# Patient Record
Sex: Male | Born: 1940 | Race: White | Hispanic: No | Marital: Married | Smoking: Never smoker
Health system: Southern US, Community
[De-identification: ages and names within clinical notes are randomized; demographics above are authoritative.]

## PROBLEM LIST (undated history)

## (undated) DIAGNOSIS — I10 Essential (primary) hypertension: Secondary | ICD-10-CM

## (undated) DIAGNOSIS — R42 Dizziness and giddiness: Secondary | ICD-10-CM

## (undated) DIAGNOSIS — K5790 Diverticulosis of intestine, part unspecified, without perforation or abscess without bleeding: Secondary | ICD-10-CM

## (undated) DIAGNOSIS — C61 Malignant neoplasm of prostate: Secondary | ICD-10-CM

## (undated) DIAGNOSIS — E785 Hyperlipidemia, unspecified: Secondary | ICD-10-CM

## (undated) DIAGNOSIS — I4891 Unspecified atrial fibrillation: Secondary | ICD-10-CM

## (undated) DIAGNOSIS — C4491 Basal cell carcinoma of skin, unspecified: Secondary | ICD-10-CM

## (undated) HISTORY — DX: Unspecified atrial fibrillation: I48.91

## (undated) HISTORY — DX: Dizziness and giddiness: R42

## (undated) HISTORY — DX: Diverticulosis of intestine, part unspecified, without perforation or abscess without bleeding: K57.90

## (undated) HISTORY — PX: COLONOSCOPY: SHX174

## (undated) HISTORY — DX: Basal cell carcinoma of skin, unspecified: C44.91

## (undated) HISTORY — DX: Hyperlipidemia, unspecified: E78.5

## (undated) HISTORY — PX: ELBOW SURGERY: SHX618

## (undated) HISTORY — PX: CATARACT EXTRACTION W/ INTRAOCULAR LENS IMPLANT: SHX1309

## (undated) HISTORY — PX: VASECTOMY: SHX75

## (undated) HISTORY — PX: KNEE ARTHROSCOPY: SUR90

## (undated) HISTORY — PX: HERNIA REPAIR: SHX51

---

## 1998-04-13 ENCOUNTER — Ambulatory Visit (HOSPITAL_COMMUNITY): Admission: RE | Admit: 1998-04-13 | Discharge: 1998-04-13 | Payer: Self-pay | Admitting: Gastroenterology

## 1998-11-17 ENCOUNTER — Observation Stay (HOSPITAL_COMMUNITY): Admission: RE | Admit: 1998-11-17 | Discharge: 1998-11-18 | Payer: Self-pay | Admitting: Orthopedic Surgery

## 1998-11-17 ENCOUNTER — Encounter: Payer: Self-pay | Admitting: Orthopedic Surgery

## 2001-04-30 ENCOUNTER — Encounter: Admission: RE | Admit: 2001-04-30 | Discharge: 2001-04-30 | Payer: Self-pay | Admitting: Orthopaedic Surgery

## 2001-05-18 ENCOUNTER — Encounter: Admission: RE | Admit: 2001-05-18 | Discharge: 2001-05-18 | Payer: Self-pay | Admitting: Orthopaedic Surgery

## 2001-06-11 ENCOUNTER — Encounter: Admission: RE | Admit: 2001-06-11 | Discharge: 2001-06-11 | Payer: Self-pay | Admitting: Orthopaedic Surgery

## 2004-07-22 ENCOUNTER — Ambulatory Visit: Payer: Self-pay | Admitting: Internal Medicine

## 2004-08-12 ENCOUNTER — Ambulatory Visit: Payer: Self-pay | Admitting: Internal Medicine

## 2004-10-29 ENCOUNTER — Encounter: Admission: RE | Admit: 2004-10-29 | Discharge: 2004-10-29 | Payer: Self-pay | Admitting: Orthopedic Surgery

## 2004-11-10 ENCOUNTER — Ambulatory Visit: Payer: Self-pay | Admitting: Internal Medicine

## 2005-03-31 ENCOUNTER — Ambulatory Visit: Payer: Self-pay | Admitting: Internal Medicine

## 2005-06-03 ENCOUNTER — Ambulatory Visit: Payer: Self-pay | Admitting: Internal Medicine

## 2005-07-13 ENCOUNTER — Ambulatory Visit: Payer: Self-pay | Admitting: Internal Medicine

## 2005-07-28 ENCOUNTER — Ambulatory Visit: Payer: Self-pay | Admitting: Internal Medicine

## 2006-03-22 ENCOUNTER — Ambulatory Visit: Payer: Self-pay | Admitting: Internal Medicine

## 2006-07-21 DIAGNOSIS — E785 Hyperlipidemia, unspecified: Secondary | ICD-10-CM

## 2007-03-21 ENCOUNTER — Ambulatory Visit: Payer: Self-pay | Admitting: Internal Medicine

## 2007-03-21 LAB — CONVERTED CEMR LAB
AST: 35 units/L (ref 0–37)
BUN: 20 mg/dL (ref 6–23)
Calcium: 9.4 mg/dL (ref 8.4–10.5)
Cholesterol: 159 mg/dL (ref 0–200)
Eosinophils Absolute: 0.2 10*3/uL (ref 0.0–0.6)
GFR calc Af Amer: 71 mL/min
GFR calc non Af Amer: 59 mL/min
HDL: 48.7 mg/dL (ref >39.0)
Lymphocytes Relative: 44.6 % (ref 12.0–46.0)
MCHC: 34.9 g/dL (ref 30.0–36.0)
MCV: 93.1 fL (ref 78.0–100.0)
Monocytes Relative: 10.2 % (ref 3.0–11.0)
Neutro Abs: 1.7 10*3/uL (ref 1.4–7.7)
PSA: 1.27 ng/mL (ref 0.10–4.00)
Platelets: 230 10*3/uL (ref 150–400)
Potassium: 5.6 meq/L — ABNORMAL HIGH (ref 3.5–5.1)
Triglycerides: 80 mg/dL (ref 0–149)

## 2007-03-28 ENCOUNTER — Ambulatory Visit: Payer: Self-pay | Admitting: Internal Medicine

## 2007-03-28 ENCOUNTER — Encounter (INDEPENDENT_AMBULATORY_CARE_PROVIDER_SITE_OTHER): Payer: Self-pay | Admitting: *Deleted

## 2007-03-29 ENCOUNTER — Ambulatory Visit: Payer: Self-pay | Admitting: Internal Medicine

## 2007-06-11 ENCOUNTER — Telehealth: Payer: Self-pay | Admitting: Internal Medicine

## 2007-10-22 ENCOUNTER — Ambulatory Visit: Payer: Self-pay | Admitting: Internal Medicine

## 2007-10-23 ENCOUNTER — Ambulatory Visit: Payer: Self-pay | Admitting: Internal Medicine

## 2007-10-24 ENCOUNTER — Telehealth: Payer: Self-pay | Admitting: Internal Medicine

## 2007-10-24 ENCOUNTER — Encounter: Admission: RE | Admit: 2007-10-24 | Discharge: 2007-10-24 | Payer: Self-pay | Admitting: Internal Medicine

## 2007-10-24 ENCOUNTER — Encounter: Payer: Self-pay | Admitting: Internal Medicine

## 2007-10-24 LAB — CONVERTED CEMR LAB
AST: 25 units/L (ref 0–37)
Amylase: 123 units/L (ref 27–131)
BUN: 17 mg/dL (ref 6–23)
Basophils Absolute: 0 10*3/uL (ref 0.0–0.1)
Creatinine, Ser: 1.3 mg/dL (ref 0.4–1.5)
Eosinophils Absolute: 0.2 10*3/uL (ref 0.0–0.7)
Hemoglobin: 15.3 g/dL (ref 13.0–17.0)
Lipase: 69 units/L — ABNORMAL HIGH (ref 11.0–59.0)
Lymphocytes Relative: 38.2 % (ref 12.0–46.0)
MCHC: 33.1 g/dL (ref 30.0–36.0)
MCV: 94.2 fL (ref 78.0–100.0)
Neutro Abs: 2.2 10*3/uL (ref 1.4–7.7)
Neutrophils Relative %: 46.6 % (ref 43.0–77.0)
RDW: 12.2 % (ref 11.5–14.6)
Total Bilirubin: 1.5 mg/dL — ABNORMAL HIGH (ref 0.3–1.2)

## 2007-10-25 ENCOUNTER — Encounter (INDEPENDENT_AMBULATORY_CARE_PROVIDER_SITE_OTHER): Payer: Self-pay | Admitting: *Deleted

## 2007-10-29 ENCOUNTER — Encounter: Admission: RE | Admit: 2007-10-29 | Discharge: 2007-10-29 | Payer: Self-pay | Admitting: Internal Medicine

## 2007-10-29 ENCOUNTER — Telehealth (INDEPENDENT_AMBULATORY_CARE_PROVIDER_SITE_OTHER): Payer: Self-pay | Admitting: *Deleted

## 2007-11-01 ENCOUNTER — Ambulatory Visit: Payer: Self-pay | Admitting: Internal Medicine

## 2007-11-01 ENCOUNTER — Encounter (INDEPENDENT_AMBULATORY_CARE_PROVIDER_SITE_OTHER): Payer: Self-pay | Admitting: *Deleted

## 2007-11-01 LAB — CONVERTED CEMR LAB: OCCULT 1: NEGATIVE

## 2008-03-26 ENCOUNTER — Ambulatory Visit: Payer: Self-pay | Admitting: Internal Medicine

## 2008-06-09 ENCOUNTER — Ambulatory Visit: Payer: Self-pay | Admitting: Internal Medicine

## 2008-06-09 DIAGNOSIS — M545 Low back pain: Secondary | ICD-10-CM

## 2008-06-09 DIAGNOSIS — K5730 Diverticulosis of large intestine without perforation or abscess without bleeding: Secondary | ICD-10-CM | POA: Insufficient documentation

## 2008-06-09 LAB — CONVERTED CEMR LAB: Cholesterol, target level: 200 mg/dL

## 2008-06-10 LAB — CONVERTED CEMR LAB
ALT: 27 units/L (ref 0–53)
Alkaline Phosphatase: 55 units/L (ref 39–117)
Basophils Absolute: 0 10*3/uL (ref 0.0–0.1)
Bilirubin, Direct: 0.2 mg/dL (ref 0.0–0.3)
CO2: 33 meq/L — ABNORMAL HIGH (ref 19–32)
Calcium: 9.7 mg/dL (ref 8.4–10.5)
Cholesterol: 165 mg/dL (ref 0–200)
Glucose, Bld: 110 mg/dL — ABNORMAL HIGH (ref 70–99)
HDL: 61 mg/dL (ref >39.0)
LDL Cholesterol: 91 mg/dL (ref 0–99)
Lymphocytes Relative: 39.3 % (ref 12.0–46.0)
MCHC: 34.6 g/dL (ref 30.0–36.0)
Monocytes Relative: 11 % (ref 3.0–12.0)
Neutrophils Relative %: 43.7 % (ref 43.0–77.0)
Platelets: 213 10*3/uL (ref 150–400)
Potassium: 5.3 meq/L — ABNORMAL HIGH (ref 3.5–5.1)
RDW: 12.2 % (ref 11.5–14.6)
Sodium: 145 meq/L (ref 135–145)
TSH: 1.79 microintl units/mL (ref 0.35–5.50)
Total Bilirubin: 2.1 mg/dL — ABNORMAL HIGH (ref 0.3–1.2)
Total CHOL/HDL Ratio: 2.7
Total Protein: 6.8 g/dL (ref 6.0–8.3)
Triglycerides: 65 mg/dL (ref 0–149)
VLDL: 13 mg/dL (ref 0–40)

## 2008-06-11 ENCOUNTER — Encounter (INDEPENDENT_AMBULATORY_CARE_PROVIDER_SITE_OTHER): Payer: Self-pay | Admitting: *Deleted

## 2008-06-24 ENCOUNTER — Encounter (INDEPENDENT_AMBULATORY_CARE_PROVIDER_SITE_OTHER): Payer: Self-pay | Admitting: *Deleted

## 2008-06-24 ENCOUNTER — Ambulatory Visit: Payer: Self-pay | Admitting: Internal Medicine

## 2008-06-24 LAB — CONVERTED CEMR LAB
OCCULT 2: NEGATIVE
OCCULT 3: NEGATIVE

## 2008-06-29 LAB — CONVERTED CEMR LAB: Hgb A1c MFr Bld: 5.9 % (ref 4.6–6.0)

## 2008-06-30 ENCOUNTER — Encounter (INDEPENDENT_AMBULATORY_CARE_PROVIDER_SITE_OTHER): Payer: Self-pay | Admitting: *Deleted

## 2008-07-21 ENCOUNTER — Telehealth (INDEPENDENT_AMBULATORY_CARE_PROVIDER_SITE_OTHER): Payer: Self-pay | Admitting: *Deleted

## 2008-12-23 ENCOUNTER — Ambulatory Visit: Payer: Self-pay | Admitting: Internal Medicine

## 2008-12-23 DIAGNOSIS — I872 Venous insufficiency (chronic) (peripheral): Secondary | ICD-10-CM | POA: Insufficient documentation

## 2008-12-26 ENCOUNTER — Ambulatory Visit: Payer: Self-pay | Admitting: Internal Medicine

## 2008-12-31 ENCOUNTER — Encounter (INDEPENDENT_AMBULATORY_CARE_PROVIDER_SITE_OTHER): Payer: Self-pay | Admitting: *Deleted

## 2008-12-31 ENCOUNTER — Telehealth (INDEPENDENT_AMBULATORY_CARE_PROVIDER_SITE_OTHER): Payer: Self-pay | Admitting: *Deleted

## 2008-12-31 LAB — CONVERTED CEMR LAB: Creatinine, Ser: 1.6 mg/dL — ABNORMAL HIGH (ref 0.4–1.5)

## 2009-01-09 ENCOUNTER — Encounter: Admission: RE | Admit: 2009-01-09 | Discharge: 2009-01-09 | Payer: Self-pay | Admitting: Internal Medicine

## 2009-01-09 ENCOUNTER — Encounter (INDEPENDENT_AMBULATORY_CARE_PROVIDER_SITE_OTHER): Payer: Self-pay | Admitting: *Deleted

## 2009-01-09 ENCOUNTER — Telehealth (INDEPENDENT_AMBULATORY_CARE_PROVIDER_SITE_OTHER): Payer: Self-pay | Admitting: *Deleted

## 2009-01-15 ENCOUNTER — Telehealth: Payer: Self-pay | Admitting: Internal Medicine

## 2009-01-22 ENCOUNTER — Encounter: Admission: RE | Admit: 2009-01-22 | Discharge: 2009-01-22 | Payer: Self-pay | Admitting: Internal Medicine

## 2009-01-29 ENCOUNTER — Ambulatory Visit: Payer: Self-pay | Admitting: Internal Medicine

## 2009-01-29 LAB — CONVERTED CEMR LAB
BUN: 16 mg/dL (ref 6–23)
Calcium: 9.6 mg/dL (ref 8.4–10.5)
GFR calc non Af Amer: 58.38 mL/min (ref >60)
Glucose, Bld: 124 mg/dL — ABNORMAL HIGH (ref 70–99)
Potassium: 4.4 meq/L (ref 3.5–5.1)
Sodium: 143 meq/L (ref 135–145)

## 2009-01-30 ENCOUNTER — Encounter (INDEPENDENT_AMBULATORY_CARE_PROVIDER_SITE_OTHER): Payer: Self-pay | Admitting: *Deleted

## 2009-03-18 ENCOUNTER — Ambulatory Visit: Payer: Self-pay | Admitting: Internal Medicine

## 2009-06-01 ENCOUNTER — Ambulatory Visit: Payer: Self-pay | Admitting: Internal Medicine

## 2009-06-01 LAB — CONVERTED CEMR LAB
ALT: 29 units/L (ref 0–53)
AST: 29 units/L (ref 0–37)
Basophils Absolute: 0.1 10*3/uL (ref 0.0–0.1)
Basophils Relative: 1.1 % (ref 0.0–3.0)
Bilirubin, Direct: 0.1 mg/dL (ref 0.0–0.3)
Cholesterol: 172 mg/dL (ref 0–200)
Eosinophils Absolute: 0.3 10*3/uL (ref 0.0–0.7)
Lymphocytes Relative: 38.1 % (ref 12.0–46.0)
MCHC: 33.8 g/dL (ref 30.0–36.0)
MCV: 95.3 fL (ref 78.0–100.0)
Monocytes Absolute: 0.6 10*3/uL (ref 0.1–1.0)
Neutrophils Relative %: 44.5 % (ref 43.0–77.0)
PSA: 3.48 ng/mL (ref 0.10–4.00)
Platelets: 239 10*3/uL (ref 150.0–400.0)
RDW: 12.2 % (ref 11.5–14.6)
TSH: 2.21 microintl units/mL (ref 0.35–5.50)
Total Bilirubin: 1.3 mg/dL — ABNORMAL HIGH (ref 0.3–1.2)
Total Protein: 6.7 g/dL (ref 6.0–8.3)

## 2009-06-11 ENCOUNTER — Ambulatory Visit: Payer: Self-pay | Admitting: Internal Medicine

## 2009-07-13 ENCOUNTER — Encounter: Payer: Self-pay | Admitting: Internal Medicine

## 2009-12-31 ENCOUNTER — Encounter: Payer: Self-pay | Admitting: Internal Medicine

## 2010-03-17 ENCOUNTER — Telehealth (INDEPENDENT_AMBULATORY_CARE_PROVIDER_SITE_OTHER): Payer: Self-pay | Admitting: *Deleted

## 2010-03-24 ENCOUNTER — Telehealth (INDEPENDENT_AMBULATORY_CARE_PROVIDER_SITE_OTHER): Payer: Self-pay | Admitting: *Deleted

## 2010-03-30 ENCOUNTER — Telehealth: Payer: Self-pay | Admitting: Internal Medicine

## 2010-04-01 ENCOUNTER — Encounter: Payer: Self-pay | Admitting: Internal Medicine

## 2010-04-05 ENCOUNTER — Encounter: Payer: Self-pay | Admitting: Internal Medicine

## 2010-05-17 ENCOUNTER — Encounter: Admission: RE | Admit: 2010-05-17 | Discharge: 2010-05-17 | Payer: Self-pay | Admitting: General Surgery

## 2010-06-07 ENCOUNTER — Encounter: Payer: Self-pay | Admitting: Internal Medicine

## 2010-06-18 ENCOUNTER — Ambulatory Visit: Payer: Self-pay | Admitting: Internal Medicine

## 2010-06-18 LAB — CONVERTED CEMR LAB
Albumin: 3.6 g/dL (ref 3.5–5.2)
Alkaline Phosphatase: 53 units/L (ref 39–117)
Basophils Absolute: 0 10*3/uL (ref 0.0–0.1)
Blood in Urine, dipstick: NEGATIVE
CO2: 30 meq/L (ref 19–32)
Calcium: 9.4 mg/dL (ref 8.4–10.5)
Cholesterol: 180 mg/dL (ref 0–200)
Creatinine, Ser: 1.2 mg/dL (ref 0.4–1.5)
Eosinophils Absolute: 0.3 10*3/uL (ref 0.0–0.7)
Glucose, Bld: 99 mg/dL (ref 70–99)
HDL: 55.4 mg/dL (ref >39.00)
Hemoglobin: 15.4 g/dL (ref 13.0–17.0)
Lymphocytes Relative: 43.1 % (ref 12.0–46.0)
Lymphs Abs: 2.5 10*3/uL (ref 0.7–4.0)
MCHC: 33.8 g/dL (ref 30.0–36.0)
Neutro Abs: 2.4 10*3/uL (ref 1.4–7.7)
Nitrite: NEGATIVE
Platelets: 236 10*3/uL (ref 150.0–400.0)
RDW: 13.5 % (ref 11.5–14.6)
Specific Gravity, Urine: 1.02
VLDL: 10.4 mg/dL (ref 0.0–40.0)
WBC Urine, dipstick: NEGATIVE

## 2010-06-28 ENCOUNTER — Encounter: Payer: Self-pay | Admitting: Internal Medicine

## 2010-06-28 ENCOUNTER — Ambulatory Visit
Admission: RE | Admit: 2010-06-28 | Discharge: 2010-06-28 | Payer: Self-pay | Source: Home / Self Care | Attending: Internal Medicine | Admitting: Internal Medicine

## 2010-07-09 ENCOUNTER — Ambulatory Visit
Admission: RE | Admit: 2010-07-09 | Discharge: 2010-07-09 | Payer: Self-pay | Source: Home / Self Care | Attending: Internal Medicine | Admitting: Internal Medicine

## 2010-07-09 ENCOUNTER — Encounter (INDEPENDENT_AMBULATORY_CARE_PROVIDER_SITE_OTHER): Payer: Self-pay | Admitting: *Deleted

## 2010-07-12 ENCOUNTER — Encounter: Payer: Self-pay | Admitting: Internal Medicine

## 2010-07-20 NOTE — Progress Notes (Signed)
Summary: refill  Phone Note Refill Request Message from:  Fax from Pharmacy on March 24, 2010 4:33 PM  Refills Requested: Medication #1:  LOVAZA 1 GM CAPS 2 by mouth two times a day. [facility]- fax [phone]  Initial call taken by: Ronny Flurry 4:34 PM  Follow-up for Phone Call        Med was filled 03/17/2010, I called [facility] and informed them filled on 9/28 if not on file to fill at this time Follow-up by: Shonna Chock CMA,  March 24, 2010 4:56 PM

## 2010-07-20 NOTE — Progress Notes (Signed)
Summary: refill  Phone Note Refill Request Message from:  Fax from Pharmacy on March 17, 2010 3:46 PM  Refills Requested: Medication #1:  LOVAZA 1 GM CAPS 2 by mouth two times a day. [facility]- fax [phone]  Initial call taken by: Okey Regal [place],  March 17, 2010 3:57 PM    Prescriptions: LOVAZA 1 GM CAPS (OMEGA-3-ACID ETHYL ESTERS) 2 by mouth two times a day  #120 x 0   Entered by:   Shonna Chock CMA   Authorized by:   Marga Melnick MD   Signed by:   Shonna Chock CMA on 03/17/2010   Method used:   Electronically to        [facility] # [phone]* (retail)       [address]       [place], [place]  [address]       Ph: [phone]       Fax: [phone]   RxID:   [id number]

## 2010-07-20 NOTE — Medication Information (Signed)
Summary: Prior Authorization for Lovaza/[employer]  Prior Authorization for Lovaza/[employer]   Imported By: Lanelle Bal 04/08/2010 15:52:44  _____________________________________________________________________  External Attachment:    Type:   Image     Comment:   External Document

## 2010-07-20 NOTE — Consult Note (Signed)
Summary: [facility]  [facility]   Imported By: Lanelle Bal 07/20/2009 11:35:29  _____________________________________________________________________  External Attachment:    Type:   Image     Comment:   External Document

## 2010-07-20 NOTE — Letter (Signed)
Summary: [facility]  [facility]   Imported By: Lennie Odor 01/14/2010 09:22:51  _____________________________________________________________________  External Attachment:    Type:   Image     Comment:   External Document

## 2010-07-20 NOTE — Medication Information (Signed)
Summary: Approval for Lovaza/[employer]  Approval for Lovaza/[employer]   Imported By: Lanelle Bal 04/14/2010 09:15:51  _____________________________________________________________________  External Attachment:    Type:   Image     Comment:   External Document

## 2010-07-20 NOTE — Letter (Signed)
Summary: [facility]  [facility]   Imported By: Lanelle Bal 04/13/2010 10:48:09  _____________________________________________________________________  External Attachment:    Type:   Image     Comment:   External Document

## 2010-07-20 NOTE — Progress Notes (Signed)
Summary: Travis Hendricks  Phone Note Refill Request Message from:  Fax from Pharmacy on March 30, 2010 3:02 PM  Refills Requested: Medication #1:  LOVAZA 1 GM CAPS 2 by mouth two times a day. [facility]IOR AUTH NEEDED     ---***NOTE--PLAN DOES NOT COVER THIS MEDICATION.   [phone]    PT ID# [id number]  Initial call taken by: Jerolyn Shin,  March 30, 2010 3:05 PM  Follow-up for Phone Call        [facility], preferred alternatives are Fenofibrate, Gemfibrozil, Lipofen, and Tricor. Please advise.  Follow-up by: Lucious Groves CMA,  March 31, 2010 10:01 AM  Additional Follow-up for Phone Call Additional follow up Details #1::        these are NOT equivalent meds; they are in totally different classes. He can call his plan & discuss. Also see if Rep for [employer] has discount cards he can use Additional Follow-up by: Marga Melnick MD,  March 31, 2010 10:23 AM    Additional Follow-up for Phone Call Additional follow up Details #2::    PA forms requested. Lucious Groves CMA  March 31, 2010 11:48 AM  Forms requested again. Lucious Groves CMA  April 01, 2010 12:21 PM   Forms completed and faxed, will await insurance company reply. Lucious Groves CMA  April 01, 2010 3:22 PM    Appended Document: Travis Hendricks Approved until 2012.

## 2010-07-22 NOTE — Assessment & Plan Note (Signed)
Summary: CPX,LABS PRIOR,UCH/RH......   Vital Signs:  Patient profile:   70 year old male Height:      68 inches Weight:      179 pounds BMI:     27.32 Temp:     98.2 degrees F oral Pulse rate:   60 / minute Resp:     14 per minute BP sitting:   126 / 80  (left arm) Cuff size:   large  Vitals Entered By: Shonna Chock CMA (June 28, 2010 8:26 AM)    History of Present Illness:    Mr. Siever is here for a physical; he continues to have discomfort @ inguinal herniorrhaphy op site. 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   Hyperlipidemia Follow-Up: The patient denies muscle aches, GI upset, abdominal pain, constipation, diarrhea, and fatigue.  The patient denies the following symptoms: chest pain/pressure, exercise intolerance, dypsnea, palpitations, syncope, and pedal edema.  Compliance with medications (by patient report) has been near 100%.  Dietary compliance has been good.  Adjunctive measures currently used by the patient include ASA and folic acid.    Lipid Management History:      Positive NCEP/ATP III risk factors include male age 83 years old or older.  Negative NCEP/ATP III risk factors include non-diabetic, no family history for ischemic heart disease, non-tobacco-user status, non-hypertensive, no ASHD (atherosclerotic heart disease), no prior stroke/TIA, no peripheral vascular disease, and no history of aortic aneurysm.     Preventive Screening-Counseling & Management  Alcohol-Tobacco     Alcohol drinks/day: 1     Smoking Status: never  Caffeine-Diet-Exercise     Caffeine use/day: 1/2-1 cup/ day  Hep-HIV-STD-Contraception     Dental Visit-last 6 months yes     Sun Exposure-Excessive: no  Safety-Violence-Falls     Seat Belt Use: yes     Smoke Detectors: yes      Blood Transfusions:  no.        Travel History:  [place] 2011.    Current Medications (verified): 1)  Crestor 20 Mg  Tabs (Rosuvastatin Calcium) .... 1/2 Tab Qd 2)   Tramadol Hcl 50 Mg Tabs (Tramadol Hcl) .Marland Kitchen.. 1-2  Q 6-8 Hrs As Needed 3)  Lovaza 1 Gm Caps (Omega-3-Acid Ethyl Esters) .... 2 By Mouth Two Times A Day  Allergies (verified): No Known Drug Allergies  Past History:  Past Medical History: Hyperlipidemia: NMR Lipoprofile :LDL 202(2539/1611), TG 92,HDL 44,: LDL goal =< 110. [place] Study LDL goal = < 160; elevated Homocysteine level, PMH of  Gilbert's Syndrome ; Peyronie's ,Dr Retta Diones, Urologist Diverticulosis, colon, Dr Ewing Schlein Psoriasis, Dr Mayford Knife  Past Surgical History: Arthroscopy L knee ; lens transplants bilaterally; Elbow surgery,tendon reinsertion, Dr Thurston Hole Colonoscopy :Tics,Dr Magod Lumbar laminectomy 2000,Dr Gioffre Vasectomy ESI X1 Inguinal herniorrhaphy 05/20/2010, Dr Abbey Chatters  Family History: Father: MI @ 57;died of  CVA  @ age 49 Mother: lung CA Siblings: bro: prostate  cancer   Social History: no diet Occupation:Avertising Executive Alcohol use-yes: socially Regular exercise-yes:  3-4X/ week , interrupted by recent surgery. He is going skiing in [place] 07/03/2010.He skis black slopesCaffeine use/day:  1/2-1 cup/ day Dental Care w/in 6 mos.:  yes Sun Exposure-Excessive:  no Seat Belt Use:  yes Blood Transfusions:  no  Review of Systems  The patient denies anorexia, fever, vision loss, decreased hearing, hoarseness, prolonged cough, headaches, melena, hematochezia, severe indigestion/heartburn, hematuria, suspicious skin lesions, depression, unusual weight change, abnormal bleeding, enlarged lymph nodes, and angioedema.  Weight up 5-6 # since surgery.  Physical Exam  General:  well-nourished,alert,appropriate and cooperative throughout examination Head:  Normocephalic and atraumatic without obvious abnormalities. No  alopecia  Eyes:  No corneal or conjunctival inflammation noted. EOMI. Perrla. Funduscopic exam benign, without hemorrhages, exudates or papilledema. Vision grossly normal; wall chart read  @ 6 ft. Ears:  External ear exam shows no significant lesions or deformities.  Otoscopic examination reveals clear canals, tympanic membranes are intact bilaterally without bulging, retraction, inflammation or discharge. Hearing is grossly normal bilaterally; whisper heard @ 6 ft. Nose:  External nasal examination shows no deformity or inflammation. Nasal mucosa are pink and moist without lesions or exudates. Mouth:  Oral mucosa and oropharynx without lesions or exudates.  Teeth in good repair. Neck:  No deformities, masses, or tenderness noted. Lungs:  Normal respiratory effort, chest expands symmetrically. Lungs are clear to auscultation, no crackles or wheezes. Heart:  Normal rate and regular rhythm. S1 and S2 normal without gallop, murmur, click, rub . S4 with  slight slurring Abdomen:  Bowel sounds positive,abdomen soft and non-tender without masses, organomegaly  noted. Subcutaneous scar tissue @ op site LLQ; no hernia present. Genitalia:  Dr Retta Diones seen Oct 2011 Msk:  No deformity or scoliosis noted of thoracic or lumbar spine.   Pulses:  R and L carotid,radial,dorsalis pedis and posterior tibial pulses are full and equal bilaterally Extremities:  No clubbing, cyanosis, edema, or deformity noted with normal full range of motion of all joints.   Neurologic:  alert & oriented X3 and DTRs symmetrical and 1/2+ @ knees Skin:  Intact without suspicious lesions or rashes Cervical Nodes:  No lymphadenopathy noted Axillary Nodes:  No palpable lymphadenopathy Inguinal Nodes:  No significant adenopathy Psych:  Oriented X3, memory intact for recent and remote, normally interactive, and good eye contact.     Impression & Recommendations:  Problem # 1:  ROUTINE GENERAL MEDICAL EXAM@HEALTH  CARE FACL (ICD-V70.0)  Orders: EKG w/ Interpretation (93000)  Problem # 2:  HYPERLIPIDEMIA (ICD-272.4)  His updated medication list for this problem includes:    Crestor 20 Mg Tabs (Rosuvastatin calcium)  .Marland Kitchen... 1/2 tab qd    Lovaza 1 Gm Caps (Omega-3-acid ethyl esters) .Marland Kitchen... 2 by mouth two times a day  Complete Medication List: 1)  Crestor 20 Mg Tabs (Rosuvastatin calcium) .... 1/2 tab qd 2)  Tramadol Hcl 50 Mg Tabs (Tramadol hcl) .Marland Kitchen.. 1-2  q 6-8 hrs as needed 3)  Lovaza 1 Gm Caps (Omega-3-acid ethyl esters) .... 2 by mouth two times a day  Other Orders: Pneumococcal Vaccine (13244) Admin 1st Vaccine (01027)  Lipid Assessment/Plan:      Based on NCEP/ATP III, the patient's risk factor category is "2 or more risk factors and a calculated 10 year CAD risk of < 20%".  The patient's lipid goals are as follows: Total cholesterol goal is 200; LDL cholesterol goal is 110; HDL cholesterol goal is 40; Triglyceride goal is 150.  His LDL cholesterol goal has been met.    Patient Instructions: 1)   Complete stool cards.Take medical record when traveling. 2)  It is important that you exercise regularly at least 20 minutes 5 times a week. If you develop chest pain, have severe difficulty breathing, or feel very tired , stop exercising immediately and seek medical attention.Avoid dead weights until LLQ discomfort resolves completely. 3)  Take an  81 mg coated Aspirin every day. Prescriptions: LOVAZA 1 GM CAPS (OMEGA-3-ACID ETHYL ESTERS) 2 by mouth two times a day  #  120 Capsule x 11   Entered and Authorized by:   Marga Melnick MD   Signed by:   Marga Melnick MD on 06/28/2010   Method used:   Print then Give to Patient   RxID:   [id number] CRESTOR 20 MG  TABS (ROSUVASTATIN CALCIUM) 1/2 tab qd  #90 Tablet x 1   Entered and Authorized by:   Marga Melnick MD   Signed by:   Marga Melnick MD on 06/28/2010   Method used:   Print then Give to Patient   RxID:   [id number]    Orders Added: 1)  Est. Patient 65& > [60109] 2)  EKG w/ Interpretation [93000] 3)  Pneumococcal Vaccine [90732] 4)  Admin 1st Vaccine [32355]   Immunizations Administered:  Pneumonia Vaccine:    Vaccine Type:  Pneumovax    Site: left deltoid    Mfr: Merck    Dose: 0.5 ml    Route: IM    Given by: Shonna Chock CMA    Exp. Date: 10/20/2011    Lot #: [id number]   Immunizations Administered:  Pneumonia Vaccine:    Vaccine Type: Pneumovax    Site: left deltoid    Mfr: Merck    Dose: 0.5 ml    Route: IM    Given by: Shonna Chock CMA    Exp. Date: 10/20/2011    Lot #: [id number]

## 2010-07-22 NOTE — Letter (Signed)
Summary: [facility]   Imported By: Lanelle Bal 06/23/2010 11:39:35  _____________________________________________________________________  External Attachment:    Type:   Image     Comment:   External Document

## 2010-07-22 NOTE — Letter (Signed)
Summary: Results Follow up Letter  Travis Hendricks  [address] [place], [place] [address]   Phone: [phone]  Fax: [phone]    07/09/2010 MRN: [id number]  Travis Hendricks [address] [place], [place]  [address]  Dear [name],  The following are the results of your recent test(s):  Test         Result    Pap Smear:        Normal _____  Not Normal _____ Comments: ______________________________________________________ Cholesterol: LDL(Bad cholesterol):         Your goal is less than:         HDL (Good cholesterol):       Your goal is more than: Comments:  ______________________________________________________ Mammogram:        Normal _____  Not Normal _____ Comments:  ___________________________________________________________________ Hemoccult:        Normal __X___  Not normal _______ Comments:    _____________________________________________________________________ Other Tests:    We routinely do not discuss normal results over the telephone.  If you desire a copy of the results, or you have any questions about this information we can discuss them at your next office visit.   Sincerely,

## 2010-12-07 ENCOUNTER — Other Ambulatory Visit: Payer: Self-pay | Admitting: Internal Medicine

## 2011-02-17 ENCOUNTER — Other Ambulatory Visit: Payer: Self-pay | Admitting: Internal Medicine

## 2011-02-17 MED ORDER — ROSUVASTATIN CALCIUM 20 MG PO TABS: ORAL_TABLET | ORAL | Status: DC

## 2011-02-17 NOTE — Telephone Encounter (Signed)
Patient will need to schedule a CPX with fasting labs for 05-2011

## 2011-05-30 ENCOUNTER — Other Ambulatory Visit: Payer: Self-pay | Admitting: Internal Medicine

## 2011-06-22 ENCOUNTER — Ambulatory Visit (HOSPITAL_BASED_OUTPATIENT_CLINIC_OR_DEPARTMENT_OTHER)
Admission: RE | Admit: 2011-06-22 | Discharge: 2011-06-22 | Disposition: A | Discharge status: Home / Self Care | Payer: Medicare Other | Source: Ambulatory Visit | Attending: Family | Admitting: Family

## 2011-06-22 ENCOUNTER — Ambulatory Visit (INDEPENDENT_AMBULATORY_CARE_PROVIDER_SITE_OTHER): Payer: Medicare Other | Admitting: Family

## 2011-06-22 ENCOUNTER — Telehealth: Payer: Self-pay | Admitting: Family

## 2011-06-22 ENCOUNTER — Encounter: Payer: Self-pay | Admitting: Family

## 2011-06-22 VITALS — BP 116/62 | HR 59 | Temp 97.7°F | Resp 14 | Wt 175.0 lb

## 2011-06-22 DIAGNOSIS — J40 Bronchitis, not specified as acute or chronic: Secondary | ICD-10-CM | POA: Diagnosis not present

## 2011-06-22 DIAGNOSIS — R059 Cough, unspecified: Secondary | ICD-10-CM

## 2011-06-22 DIAGNOSIS — R05 Cough: Secondary | ICD-10-CM | POA: Insufficient documentation

## 2011-06-22 MED ORDER — LEVOFLOXACIN 500 MG PO TABS: 500.0000 mg | ORAL_TABLET | Freq: Every day | ORAL | Status: AC

## 2011-06-22 MED ORDER — BENZONATATE 100 MG PO CAPS: 100.0000 mg | ORAL_CAPSULE | Freq: Three times a day (TID) | ORAL | Status: AC | PRN

## 2011-06-22 NOTE — Patient Instructions (Signed)
Please complete your chest x-ray on the first floor.  Call if your symptoms worsen or if you are not feeling better in 2-3 days.  Follow up with Dr. Alwyn Ren next week as scheduled.

## 2011-06-22 NOTE — Telephone Encounter (Signed)
Reviewed CXR- negative for pneumonia. Pt was notified.

## 2011-06-22 NOTE — Progress Notes (Signed)
  Subjective:    Patient ID: Travis Hendricks, male    DOB: 1941/06/16, 71 y.o.   MRN: [id number]  HPI  Mr.  Travis Hendricks is a 71 yr old male with chief complaint of cough.  Symptoms started 3 weeks ago. Symptoms have waxed and waned.  Denies known fever.  Cough is worst at night.  Productive of green/yellow sputum. Denies hemoptysis.  He has some nasal congestion.  He has never been a smoker.      Review of Systems  Pmx- hyperlipidemia, gilberts, venous insufficiency, diverticulosis, chronic low back pain. No past medical history on file.  History   Social History  . Marital Status: Married    Spouse Name: N/A    Number of Children: N/A  . Years of Education: N/A   Occupational History  . Not on file.   Social History Main Topics  . Smoking status: Never Smoker   . Smokeless tobacco: Never Used  . Alcohol Use: Not on file  . Drug Use: Not on file  . Sexually Active: Not on file   Other Topics Concern  . Not on file   Social History Narrative  . No narrative on file    No past surgical history on file.  No family history on file.  No Known Allergies  Current Outpatient Prescriptions on File Prior to Visit  Medication Sig Dispense Refill  . CRESTOR 20 MG tablet TAKE DAILY AS DIRECTED. LABS DUE 05/2011.  45 each  0  . LOVAZA 1 G capsule TAKE 2 CAPSULES BY MOUTH TWICE DAILY  120 capsule  2    BP 116/62  Pulse 59  Temp(Src) 97.7 F (36.5 C) (Oral)  Resp 14  Wt 175 lb (79.379 kg)  SpO2 94%       Objective:   Physical Exam  Constitutional: He appears well-developed and well-nourished. No distress.  HENT:  Head: Normocephalic and atraumatic.  Right Ear: Tympanic membrane and ear canal normal.  Left Ear: Tympanic membrane and ear canal normal.  Mouth/Throat: No posterior oropharyngeal edema or posterior oropharyngeal erythema.  Cardiovascular: Normal rate and regular rhythm.   No murmur heard. Pulmonary/Chest: Effort normal. No respiratory distress. He has  decreased breath sounds in the right lower field and the left lower field. He has no wheezes. He has no rales.  Skin: Skin is warm and dry.  Psychiatric: He has a normal mood and affect. His behavior is normal. Judgment and thought content normal.          Assessment & Plan:

## 2011-06-22 NOTE — Assessment & Plan Note (Signed)
Given his oxygen sat of 94% and duration of symptoms, will obtain a chest x-ray to exclude pneumonia.  Will treat with levaquin and prn tessalon.

## 2011-06-28 ENCOUNTER — Encounter: Payer: Self-pay | Admitting: Internal Medicine

## 2011-06-28 ENCOUNTER — Ambulatory Visit (INDEPENDENT_AMBULATORY_CARE_PROVIDER_SITE_OTHER): Payer: Medicare Other | Admitting: Internal Medicine

## 2011-06-28 VITALS — BP 124/70 | HR 50 | Temp 97.4°F | Resp 12 | Ht 68.0 in | Wt 177.2 lb

## 2011-06-28 DIAGNOSIS — K5730 Diverticulosis of large intestine without perforation or abscess without bleeding: Secondary | ICD-10-CM | POA: Diagnosis not present

## 2011-06-28 DIAGNOSIS — Z0000 Encounter for general adult medical examination without abnormal findings: Secondary | ICD-10-CM

## 2011-06-28 DIAGNOSIS — E785 Hyperlipidemia, unspecified: Secondary | ICD-10-CM | POA: Diagnosis not present

## 2011-06-28 DIAGNOSIS — Z136 Encounter for screening for cardiovascular disorders: Secondary | ICD-10-CM

## 2011-06-28 LAB — BASIC METABOLIC PANEL
CO2: 30 mEq/L (ref 19–32)
Chloride: 107 mEq/L (ref 96–112)
Glucose, Bld: 103 mg/dL — ABNORMAL HIGH (ref 70–99)
Potassium: 5 mEq/L (ref 3.5–5.1)
Sodium: 143 mEq/L (ref 135–145)

## 2011-06-28 LAB — HEPATIC FUNCTION PANEL
AST: 25 U/L (ref 0–37)
Bilirubin, Direct: 0.2 mg/dL (ref 0.0–0.3)
Total Bilirubin: 1.7 mg/dL — ABNORMAL HIGH (ref 0.3–1.2)

## 2011-06-28 LAB — CBC WITH DIFFERENTIAL/PLATELET
Basophils Relative: 0.7 % (ref 0.0–3.0)
Eosinophils Absolute: 0.2 10*3/uL (ref 0.0–0.7)
HCT: 44.4 % (ref 39.0–52.0)
Hemoglobin: 15.1 g/dL (ref 13.0–17.0)
Lymphocytes Relative: 40.4 % (ref 12.0–46.0)
Lymphs Abs: 1.9 10*3/uL (ref 0.7–4.0)
MCHC: 34 g/dL (ref 30.0–36.0)
MCV: 95.4 fl (ref 78.0–100.0)
Monocytes Absolute: 0.6 10*3/uL (ref 0.1–1.0)
Neutro Abs: 2 10*3/uL (ref 1.4–7.7)
RBC: 4.65 Mil/uL (ref 4.22–5.81)

## 2011-06-28 LAB — TSH: TSH: 1.39 u[IU]/mL (ref 0.35–5.50)

## 2011-06-28 LAB — LIPID PANEL
HDL: 59 mg/dL (ref >39.00)
Total CHOL/HDL Ratio: 2

## 2011-06-28 NOTE — Assessment & Plan Note (Signed)
He denies any GI symptoms. Standard care for colonoscopy discussed

## 2011-06-28 NOTE — Progress Notes (Signed)
Subjective:    Patient ID: Travis Hendricks, male    DOB: May 24, 1941, 71 y.o.   MRN: [id number]  HPI Medicare Wellness Visit:  The following psychosocial & medical history were reviewed as required by Medicare.   Social history: caffeine: 1 cup/day , alcohol:  socially ,  tobacco use : never  & exercise : 5X/ week as running & personal training.   Home & personal  safety / fall risk: no issues; activities of daily living: no limitations , seatbelt use : yes , and smoke alarm employment : yes  Power of Attorney/Living Will status : in place  Vision ( as recorded per Nurse) & Hearing  evaluation :  Ophth appt 06/18/12. Wall chart read & whisper heard @ 6 ft Orientation :oriented X 3 , memory & recall :good,  math testing:good ,and mood & affect : normal. Depression / anxiety: no issues Travel history : last [place] , immunization status :PNA vaccine  needed , transfusion history:  no, and preventive health surveillance ( colonoscopies, BMD , etc as per protocol/ [facility]): colonoscopy up to date, Dental care:  Every 6 mos . Chart reviewed &  Updated. Active issues reviewed & addressed.       Review of Systems Dyslipidemia assessment: Prior Advanced Lipid Testing: NMR 2006   Family history of premature CAD/ MI: father @ 50 .  Nutrition: heart healthy diet .  Diabetes : no . HTN: no.  Meds compliance: Yes, Crestor 10 mg daily    Weight :  stable ROS: fatigue: no ; chest pain : no ;claudication: no; palpitations: no; abd pain/bowel changes: no ; myalgias:no;  syncope : no ; memory loss: no;skin changes: no.  He does take glucosamine chondroitin for joint symptoms. He was unaware that this may raise cholesterol.       Objective:   Physical Exam Gen.: Healthy and well-nourished in appearance. Alert, appropriate and cooperative throughout exam. Head: Normocephalic without obvious abnormalities Eyes: No corneal or conjunctival inflammation noted. Pupils equal round reactive to light and  accommodation. Fundal exam is benign without hemorrhages, exudate, papilledema. Extraocular motion intact.  Ears: External  ear exam reveals no significant lesions or deformities. Canals clear .TMs normal.  Nose: External nasal exam reveals no deformity or inflammation. Nasal mucosa are pink and moist. No lesions or exudates noted.   Mouth: Oral mucosa and oropharynx reveal no lesions or exudates. Teeth in good repair. Neck: No deformities, masses, or tenderness noted. Range of motion &. Thyroid normal Lungs: Normal respiratory effort; chest expands symmetrically. Lungs are clear to auscultation without rales, wheezes, or increased work of breathing. Heart: Normal rate and rhythm. Normal S1 and S2. No gallop, click, or rub. Grade 1/2 -1 systolic murmur. Abdomen: Bowel sounds normal; abdomen soft and nontender. No masses, organomegaly or hernias noted. Genitalia:Dr Dahlstedt   .                                                                                   Musculoskeletal/extremities: No deformity or scoliosis noted of  the thoracic or lumbar spine. No clubbing, cyanosis, edema, or deformity noted. Range of motion  normal .Tone & strength  normal.Joints normal.  Nail health  good. Vascular: Carotid, radial artery, dorsalis pedis and  posterior tibial pulses are full and equal. No bruits present. Neurologic: Alert and oriented x3. Deep tendon reflexes symmetrical and normal.          Skin: Intact without suspicious lesions or rashes. Lymph: No cervical, axillary lymphadenopathy present. Psych: Mood and affect are normal. Normally interactive                                                                                          Assessment & Plan:  #1 Medicare Wellness Exam; criteria met ; data entered #2 Problem List reviewed ; Assessment/ Recommendations made Plan: see Orders    EKG is normal. RSR pattern in V1 would be suggestive of incomplete right bundle branch block,but  duration  criteria are not present. No ischemic changes are present.

## 2011-06-28 NOTE — Assessment & Plan Note (Signed)
Fasting lipids and hepatic panel will be completed. LDL goal is less than 100, ideally less than 80.

## 2011-07-02 NOTE — Patient Instructions (Addendum)
Preventive Health Care: Exercise at least 30-45 minutes a day,  3-4 days a week.  Eat a low-fat diet with lots of fruits and vegetables, up to 7-9 servings per day. Consume less than 40 grams of sugar per day from foods & drinks with High Fructose Corn Sugar as # 1,2,3 or # 4 on label. Please review Dr Willett's book Eat, Drink & Be Healthy for dietary cholesterol information.   

## 2011-07-20 DIAGNOSIS — H26499 Other secondary cataract, unspecified eye: Secondary | ICD-10-CM | POA: Diagnosis not present

## 2011-12-02 ENCOUNTER — Other Ambulatory Visit: Payer: Self-pay | Admitting: Internal Medicine

## 2011-12-02 NOTE — Telephone Encounter (Signed)
Done

## 2012-01-25 DIAGNOSIS — N486 Induration penis plastica: Secondary | ICD-10-CM | POA: Diagnosis not present

## 2012-01-25 DIAGNOSIS — N529 Male erectile dysfunction, unspecified: Secondary | ICD-10-CM | POA: Diagnosis not present

## 2012-01-25 DIAGNOSIS — R972 Elevated prostate specific antigen [PSA]: Secondary | ICD-10-CM | POA: Diagnosis not present

## 2012-02-25 ENCOUNTER — Other Ambulatory Visit: Payer: Self-pay | Admitting: Internal Medicine

## 2012-04-13 DIAGNOSIS — Z23 Encounter for immunization: Secondary | ICD-10-CM | POA: Diagnosis not present

## 2012-04-30 DIAGNOSIS — D239 Other benign neoplasm of skin, unspecified: Secondary | ICD-10-CM | POA: Diagnosis not present

## 2012-04-30 DIAGNOSIS — L910 Hypertrophic scar: Secondary | ICD-10-CM | POA: Diagnosis not present

## 2012-04-30 DIAGNOSIS — L570 Actinic keratosis: Secondary | ICD-10-CM | POA: Diagnosis not present

## 2012-04-30 DIAGNOSIS — L819 Disorder of pigmentation, unspecified: Secondary | ICD-10-CM | POA: Diagnosis not present

## 2012-04-30 DIAGNOSIS — D1801 Hemangioma of skin and subcutaneous tissue: Secondary | ICD-10-CM | POA: Diagnosis not present

## 2012-04-30 DIAGNOSIS — L821 Other seborrheic keratosis: Secondary | ICD-10-CM | POA: Diagnosis not present

## 2012-05-23 ENCOUNTER — Other Ambulatory Visit: Payer: Self-pay | Admitting: Internal Medicine

## 2012-06-29 DIAGNOSIS — Z0000 Encounter for general adult medical examination without abnormal findings: Secondary | ICD-10-CM | POA: Diagnosis not present

## 2012-06-29 DIAGNOSIS — R1032 Left lower quadrant pain: Secondary | ICD-10-CM | POA: Diagnosis not present

## 2012-06-29 DIAGNOSIS — Z1331 Encounter for screening for depression: Secondary | ICD-10-CM | POA: Diagnosis not present

## 2012-06-29 DIAGNOSIS — E782 Mixed hyperlipidemia: Secondary | ICD-10-CM | POA: Diagnosis not present

## 2012-06-29 DIAGNOSIS — M545 Low back pain: Secondary | ICD-10-CM | POA: Diagnosis not present

## 2012-06-29 DIAGNOSIS — R972 Elevated prostate specific antigen [PSA]: Secondary | ICD-10-CM | POA: Diagnosis not present

## 2012-06-29 DIAGNOSIS — K5730 Diverticulosis of large intestine without perforation or abscess without bleeding: Secondary | ICD-10-CM | POA: Diagnosis not present

## 2012-06-29 DIAGNOSIS — Z79899 Other long term (current) drug therapy: Secondary | ICD-10-CM | POA: Diagnosis not present

## 2012-06-29 DIAGNOSIS — N529 Male erectile dysfunction, unspecified: Secondary | ICD-10-CM | POA: Diagnosis not present

## 2012-07-24 ENCOUNTER — Encounter: Payer: PRIVATE HEALTH INSURANCE | Admitting: Internal Medicine

## 2012-08-06 DIAGNOSIS — H26499 Other secondary cataract, unspecified eye: Secondary | ICD-10-CM | POA: Diagnosis not present

## 2012-08-09 ENCOUNTER — Encounter: Payer: PRIVATE HEALTH INSURANCE | Admitting: Internal Medicine

## 2012-08-10 ENCOUNTER — Encounter: Payer: PRIVATE HEALTH INSURANCE | Admitting: Internal Medicine

## 2012-08-20 ENCOUNTER — Other Ambulatory Visit: Payer: Self-pay | Admitting: Internal Medicine

## 2012-08-21 NOTE — Telephone Encounter (Signed)
Lipid/Hep 272.4/995.20  

## 2012-10-29 DIAGNOSIS — N182 Chronic kidney disease, stage 2 (mild): Secondary | ICD-10-CM | POA: Diagnosis not present

## 2012-10-30 ENCOUNTER — Other Ambulatory Visit: Payer: Self-pay | Admitting: Internal Medicine

## 2012-10-30 NOTE — Telephone Encounter (Signed)
Lipid/Hep 272.4/995.20  

## 2012-11-21 DIAGNOSIS — E782 Mixed hyperlipidemia: Secondary | ICD-10-CM | POA: Diagnosis not present

## 2012-11-21 DIAGNOSIS — N529 Male erectile dysfunction, unspecified: Secondary | ICD-10-CM | POA: Diagnosis not present

## 2012-11-21 DIAGNOSIS — R030 Elevated blood-pressure reading, without diagnosis of hypertension: Secondary | ICD-10-CM | POA: Diagnosis not present

## 2012-11-22 DIAGNOSIS — N182 Chronic kidney disease, stage 2 (mild): Secondary | ICD-10-CM | POA: Diagnosis not present

## 2012-11-26 DIAGNOSIS — N182 Chronic kidney disease, stage 2 (mild): Secondary | ICD-10-CM | POA: Diagnosis not present

## 2013-02-07 DIAGNOSIS — H26499 Other secondary cataract, unspecified eye: Secondary | ICD-10-CM | POA: Diagnosis not present

## 2013-04-04 DIAGNOSIS — Z23 Encounter for immunization: Secondary | ICD-10-CM | POA: Diagnosis not present

## 2013-05-03 DIAGNOSIS — R030 Elevated blood-pressure reading, without diagnosis of hypertension: Secondary | ICD-10-CM | POA: Diagnosis not present

## 2013-07-03 DIAGNOSIS — Z1331 Encounter for screening for depression: Secondary | ICD-10-CM | POA: Diagnosis not present

## 2013-07-03 DIAGNOSIS — K5730 Diverticulosis of large intestine without perforation or abscess without bleeding: Secondary | ICD-10-CM | POA: Diagnosis not present

## 2013-07-03 DIAGNOSIS — N529 Male erectile dysfunction, unspecified: Secondary | ICD-10-CM | POA: Diagnosis not present

## 2013-07-03 DIAGNOSIS — R030 Elevated blood-pressure reading, without diagnosis of hypertension: Secondary | ICD-10-CM | POA: Diagnosis not present

## 2013-07-03 DIAGNOSIS — E782 Mixed hyperlipidemia: Secondary | ICD-10-CM | POA: Diagnosis not present

## 2013-07-03 DIAGNOSIS — Z0000 Encounter for general adult medical examination without abnormal findings: Secondary | ICD-10-CM | POA: Diagnosis not present

## 2013-07-03 DIAGNOSIS — R972 Elevated prostate specific antigen [PSA]: Secondary | ICD-10-CM | POA: Diagnosis not present

## 2013-07-03 DIAGNOSIS — N183 Chronic kidney disease, stage 3 unspecified: Secondary | ICD-10-CM | POA: Diagnosis not present

## 2013-07-03 DIAGNOSIS — Z79899 Other long term (current) drug therapy: Secondary | ICD-10-CM | POA: Diagnosis not present

## 2013-07-11 ENCOUNTER — Encounter: Payer: Self-pay | Admitting: Internal Medicine

## 2013-07-25 DIAGNOSIS — K5730 Diverticulosis of large intestine without perforation or abscess without bleeding: Secondary | ICD-10-CM | POA: Diagnosis not present

## 2013-07-25 DIAGNOSIS — D126 Benign neoplasm of colon, unspecified: Secondary | ICD-10-CM | POA: Diagnosis not present

## 2013-07-25 DIAGNOSIS — Z1211 Encounter for screening for malignant neoplasm of colon: Secondary | ICD-10-CM | POA: Diagnosis not present

## 2013-08-22 ENCOUNTER — Encounter: Payer: PRIVATE HEALTH INSURANCE | Admitting: Internal Medicine

## 2013-08-26 DIAGNOSIS — N486 Induration penis plastica: Secondary | ICD-10-CM | POA: Diagnosis not present

## 2013-08-26 DIAGNOSIS — N529 Male erectile dysfunction, unspecified: Secondary | ICD-10-CM | POA: Diagnosis not present

## 2013-08-26 DIAGNOSIS — R972 Elevated prostate specific antigen [PSA]: Secondary | ICD-10-CM | POA: Diagnosis not present

## 2013-09-24 DIAGNOSIS — R05 Cough: Secondary | ICD-10-CM | POA: Diagnosis not present

## 2013-09-24 DIAGNOSIS — R059 Cough, unspecified: Secondary | ICD-10-CM | POA: Diagnosis not present

## 2013-10-22 DIAGNOSIS — L820 Inflamed seborrheic keratosis: Secondary | ICD-10-CM | POA: Diagnosis not present

## 2013-10-22 DIAGNOSIS — L570 Actinic keratosis: Secondary | ICD-10-CM | POA: Diagnosis not present

## 2013-10-22 DIAGNOSIS — D1801 Hemangioma of skin and subcutaneous tissue: Secondary | ICD-10-CM | POA: Diagnosis not present

## 2013-10-22 DIAGNOSIS — D239 Other benign neoplasm of skin, unspecified: Secondary | ICD-10-CM | POA: Diagnosis not present

## 2013-10-22 DIAGNOSIS — L821 Other seborrheic keratosis: Secondary | ICD-10-CM | POA: Diagnosis not present

## 2013-10-22 DIAGNOSIS — L819 Disorder of pigmentation, unspecified: Secondary | ICD-10-CM | POA: Diagnosis not present

## 2014-01-08 DIAGNOSIS — L03119 Cellulitis of unspecified part of limb: Secondary | ICD-10-CM | POA: Diagnosis not present

## 2014-01-08 DIAGNOSIS — M109 Gout, unspecified: Secondary | ICD-10-CM | POA: Diagnosis not present

## 2014-01-08 DIAGNOSIS — L02419 Cutaneous abscess of limb, unspecified: Secondary | ICD-10-CM | POA: Diagnosis not present

## 2014-03-24 DIAGNOSIS — Z23 Encounter for immunization: Secondary | ICD-10-CM | POA: Diagnosis not present

## 2014-07-14 DIAGNOSIS — M1000 Idiopathic gout, unspecified site: Secondary | ICD-10-CM | POA: Diagnosis not present

## 2014-07-14 DIAGNOSIS — R030 Elevated blood-pressure reading, without diagnosis of hypertension: Secondary | ICD-10-CM | POA: Diagnosis not present

## 2014-07-14 DIAGNOSIS — N529 Male erectile dysfunction, unspecified: Secondary | ICD-10-CM | POA: Diagnosis not present

## 2014-07-14 DIAGNOSIS — N183 Chronic kidney disease, stage 3 (moderate): Secondary | ICD-10-CM | POA: Diagnosis not present

## 2014-07-14 DIAGNOSIS — Z0000 Encounter for general adult medical examination without abnormal findings: Secondary | ICD-10-CM | POA: Diagnosis not present

## 2014-07-14 DIAGNOSIS — K5730 Diverticulosis of large intestine without perforation or abscess without bleeding: Secondary | ICD-10-CM | POA: Diagnosis not present

## 2014-07-14 DIAGNOSIS — E559 Vitamin D deficiency, unspecified: Secondary | ICD-10-CM | POA: Diagnosis not present

## 2014-07-14 DIAGNOSIS — E782 Mixed hyperlipidemia: Secondary | ICD-10-CM | POA: Diagnosis not present

## 2014-07-14 DIAGNOSIS — R972 Elevated prostate specific antigen [PSA]: Secondary | ICD-10-CM | POA: Diagnosis not present

## 2014-07-14 DIAGNOSIS — Z23 Encounter for immunization: Secondary | ICD-10-CM | POA: Diagnosis not present

## 2014-09-01 DIAGNOSIS — N5201 Erectile dysfunction due to arterial insufficiency: Secondary | ICD-10-CM | POA: Diagnosis not present

## 2014-09-01 DIAGNOSIS — R972 Elevated prostate specific antigen [PSA]: Secondary | ICD-10-CM | POA: Diagnosis not present

## 2014-10-14 DIAGNOSIS — M7521 Bicipital tendinitis, right shoulder: Secondary | ICD-10-CM | POA: Diagnosis not present

## 2014-10-22 DIAGNOSIS — M7521 Bicipital tendinitis, right shoulder: Secondary | ICD-10-CM | POA: Diagnosis not present

## 2014-10-28 DIAGNOSIS — D225 Melanocytic nevi of trunk: Secondary | ICD-10-CM | POA: Diagnosis not present

## 2014-10-28 DIAGNOSIS — L821 Other seborrheic keratosis: Secondary | ICD-10-CM | POA: Diagnosis not present

## 2014-10-28 DIAGNOSIS — D1801 Hemangioma of skin and subcutaneous tissue: Secondary | ICD-10-CM | POA: Diagnosis not present

## 2014-10-28 DIAGNOSIS — M7521 Bicipital tendinitis, right shoulder: Secondary | ICD-10-CM | POA: Diagnosis not present

## 2014-10-28 DIAGNOSIS — L812 Freckles: Secondary | ICD-10-CM | POA: Diagnosis not present

## 2014-10-30 DIAGNOSIS — M7521 Bicipital tendinitis, right shoulder: Secondary | ICD-10-CM | POA: Diagnosis not present

## 2014-11-06 DIAGNOSIS — M7521 Bicipital tendinitis, right shoulder: Secondary | ICD-10-CM | POA: Diagnosis not present

## 2014-11-11 DIAGNOSIS — M7521 Bicipital tendinitis, right shoulder: Secondary | ICD-10-CM | POA: Diagnosis not present

## 2014-11-12 DIAGNOSIS — M7521 Bicipital tendinitis, right shoulder: Secondary | ICD-10-CM | POA: Diagnosis not present

## 2014-11-24 DIAGNOSIS — M7521 Bicipital tendinitis, right shoulder: Secondary | ICD-10-CM | POA: Diagnosis not present

## 2015-02-03 DIAGNOSIS — S96912A Strain of unspecified muscle and tendon at ankle and foot level, left foot, initial encounter: Secondary | ICD-10-CM | POA: Diagnosis not present

## 2015-03-25 DIAGNOSIS — M25561 Pain in right knee: Secondary | ICD-10-CM | POA: Diagnosis not present

## 2015-03-25 DIAGNOSIS — Z23 Encounter for immunization: Secondary | ICD-10-CM | POA: Diagnosis not present

## 2015-04-02 DIAGNOSIS — M25561 Pain in right knee: Secondary | ICD-10-CM | POA: Diagnosis not present

## 2015-04-08 DIAGNOSIS — M23331 Other meniscus derangements, other medial meniscus, right knee: Secondary | ICD-10-CM | POA: Diagnosis not present

## 2015-04-08 DIAGNOSIS — M25561 Pain in right knee: Secondary | ICD-10-CM | POA: Diagnosis not present

## 2015-07-16 DIAGNOSIS — J209 Acute bronchitis, unspecified: Secondary | ICD-10-CM | POA: Diagnosis not present

## 2015-08-05 DIAGNOSIS — K5730 Diverticulosis of large intestine without perforation or abscess without bleeding: Secondary | ICD-10-CM | POA: Diagnosis not present

## 2015-08-05 DIAGNOSIS — N529 Male erectile dysfunction, unspecified: Secondary | ICD-10-CM | POA: Diagnosis not present

## 2015-08-05 DIAGNOSIS — N183 Chronic kidney disease, stage 3 (moderate): Secondary | ICD-10-CM | POA: Diagnosis not present

## 2015-08-05 DIAGNOSIS — Z1389 Encounter for screening for other disorder: Secondary | ICD-10-CM | POA: Diagnosis not present

## 2015-08-05 DIAGNOSIS — R972 Elevated prostate specific antigen [PSA]: Secondary | ICD-10-CM | POA: Diagnosis not present

## 2015-08-05 DIAGNOSIS — E782 Mixed hyperlipidemia: Secondary | ICD-10-CM | POA: Diagnosis not present

## 2015-08-05 DIAGNOSIS — Z79899 Other long term (current) drug therapy: Secondary | ICD-10-CM | POA: Diagnosis not present

## 2015-08-05 DIAGNOSIS — Z0001 Encounter for general adult medical examination with abnormal findings: Secondary | ICD-10-CM | POA: Diagnosis not present

## 2015-08-05 DIAGNOSIS — R030 Elevated blood-pressure reading, without diagnosis of hypertension: Secondary | ICD-10-CM | POA: Diagnosis not present

## 2015-08-05 DIAGNOSIS — E559 Vitamin D deficiency, unspecified: Secondary | ICD-10-CM | POA: Diagnosis not present

## 2015-09-02 DIAGNOSIS — Z125 Encounter for screening for malignant neoplasm of prostate: Secondary | ICD-10-CM | POA: Diagnosis not present

## 2015-09-02 DIAGNOSIS — N5201 Erectile dysfunction due to arterial insufficiency: Secondary | ICD-10-CM | POA: Diagnosis not present

## 2015-09-02 DIAGNOSIS — R972 Elevated prostate specific antigen [PSA]: Secondary | ICD-10-CM | POA: Diagnosis not present

## 2015-10-27 DIAGNOSIS — D2271 Melanocytic nevi of right lower limb, including hip: Secondary | ICD-10-CM | POA: Diagnosis not present

## 2015-10-27 DIAGNOSIS — R972 Elevated prostate specific antigen [PSA]: Secondary | ICD-10-CM | POA: Diagnosis not present

## 2015-10-27 DIAGNOSIS — D485 Neoplasm of uncertain behavior of skin: Secondary | ICD-10-CM | POA: Diagnosis not present

## 2015-10-27 DIAGNOSIS — L821 Other seborrheic keratosis: Secondary | ICD-10-CM | POA: Diagnosis not present

## 2015-10-27 DIAGNOSIS — D225 Melanocytic nevi of trunk: Secondary | ICD-10-CM | POA: Diagnosis not present

## 2015-10-27 DIAGNOSIS — L812 Freckles: Secondary | ICD-10-CM | POA: Diagnosis not present

## 2015-10-27 DIAGNOSIS — D1801 Hemangioma of skin and subcutaneous tissue: Secondary | ICD-10-CM | POA: Diagnosis not present

## 2015-10-27 DIAGNOSIS — C44619 Basal cell carcinoma of skin of left upper limb, including shoulder: Secondary | ICD-10-CM | POA: Diagnosis not present

## 2015-11-25 ENCOUNTER — Encounter: Payer: Self-pay | Admitting: Internal Medicine

## 2015-11-25 DIAGNOSIS — C4491 Basal cell carcinoma of skin, unspecified: Secondary | ICD-10-CM | POA: Insufficient documentation

## 2015-12-09 DIAGNOSIS — R972 Elevated prostate specific antigen [PSA]: Secondary | ICD-10-CM | POA: Diagnosis not present

## 2015-12-09 DIAGNOSIS — C61 Malignant neoplasm of prostate: Secondary | ICD-10-CM | POA: Diagnosis not present

## 2015-12-18 ENCOUNTER — Encounter: Payer: Self-pay | Admitting: Internal Medicine

## 2015-12-18 DIAGNOSIS — C61 Malignant neoplasm of prostate: Secondary | ICD-10-CM | POA: Diagnosis not present

## 2016-01-11 DIAGNOSIS — C61 Malignant neoplasm of prostate: Secondary | ICD-10-CM | POA: Diagnosis not present

## 2016-03-09 ENCOUNTER — Other Ambulatory Visit: Payer: Self-pay | Admitting: Gastroenterology

## 2016-03-09 DIAGNOSIS — R634 Abnormal weight loss: Secondary | ICD-10-CM

## 2016-03-09 DIAGNOSIS — K529 Noninfective gastroenteritis and colitis, unspecified: Secondary | ICD-10-CM

## 2016-03-09 DIAGNOSIS — R1033 Periumbilical pain: Secondary | ICD-10-CM | POA: Diagnosis not present

## 2016-03-09 DIAGNOSIS — A09 Infectious gastroenteritis and colitis, unspecified: Secondary | ICD-10-CM | POA: Diagnosis not present

## 2016-03-14 DIAGNOSIS — M25561 Pain in right knee: Secondary | ICD-10-CM | POA: Diagnosis not present

## 2016-03-14 DIAGNOSIS — M23331 Other meniscus derangements, other medial meniscus, right knee: Secondary | ICD-10-CM | POA: Diagnosis not present

## 2016-03-21 ENCOUNTER — Ambulatory Visit
Admission: RE | Admit: 2016-03-21 | Discharge: 2016-03-21 | Disposition: A | Discharge status: Home / Self Care | Payer: Medicare Other | Source: Ambulatory Visit | Attending: Gastroenterology | Admitting: Gastroenterology

## 2016-03-21 DIAGNOSIS — K5730 Diverticulosis of large intestine without perforation or abscess without bleeding: Secondary | ICD-10-CM | POA: Diagnosis not present

## 2016-03-21 DIAGNOSIS — R1033 Periumbilical pain: Secondary | ICD-10-CM

## 2016-03-21 DIAGNOSIS — K529 Noninfective gastroenteritis and colitis, unspecified: Secondary | ICD-10-CM

## 2016-03-21 DIAGNOSIS — R634 Abnormal weight loss: Secondary | ICD-10-CM

## 2016-03-21 MED ORDER — IOPAMIDOL (ISOVUE-300) INJECTION 61%
100.0000 mL | Freq: Once | INTRAVENOUS | Status: AC | PRN
  Administered 2016-03-21: 100 mL via INTRAVENOUS

## 2016-03-22 DIAGNOSIS — M23231 Derangement of other medial meniscus due to old tear or injury, right knee: Secondary | ICD-10-CM | POA: Diagnosis not present

## 2016-03-22 DIAGNOSIS — S83281A Other tear of lateral meniscus, current injury, right knee, initial encounter: Secondary | ICD-10-CM | POA: Diagnosis not present

## 2016-03-22 DIAGNOSIS — S83241A Other tear of medial meniscus, current injury, right knee, initial encounter: Secondary | ICD-10-CM | POA: Diagnosis not present

## 2016-03-22 DIAGNOSIS — M23241 Derangement of anterior horn of lateral meniscus due to old tear or injury, right knee: Secondary | ICD-10-CM | POA: Diagnosis not present

## 2016-03-22 DIAGNOSIS — M25761 Osteophyte, right knee: Secondary | ICD-10-CM | POA: Diagnosis not present

## 2016-03-29 DIAGNOSIS — Z4789 Encounter for other orthopedic aftercare: Secondary | ICD-10-CM | POA: Diagnosis not present

## 2016-04-06 DIAGNOSIS — C61 Malignant neoplasm of prostate: Secondary | ICD-10-CM | POA: Diagnosis not present

## 2016-04-06 DIAGNOSIS — N5201 Erectile dysfunction due to arterial insufficiency: Secondary | ICD-10-CM | POA: Diagnosis not present

## 2016-04-07 DIAGNOSIS — Z23 Encounter for immunization: Secondary | ICD-10-CM | POA: Diagnosis not present

## 2016-04-07 DIAGNOSIS — H26493 Other secondary cataract, bilateral: Secondary | ICD-10-CM | POA: Diagnosis not present

## 2016-04-07 DIAGNOSIS — H524 Presbyopia: Secondary | ICD-10-CM | POA: Diagnosis not present

## 2016-04-07 DIAGNOSIS — H35372 Puckering of macula, left eye: Secondary | ICD-10-CM | POA: Diagnosis not present

## 2016-04-27 DIAGNOSIS — Z4789 Encounter for other orthopedic aftercare: Secondary | ICD-10-CM | POA: Diagnosis not present

## 2016-05-03 DIAGNOSIS — H35373 Puckering of macula, bilateral: Secondary | ICD-10-CM | POA: Diagnosis not present

## 2016-05-03 DIAGNOSIS — H43813 Vitreous degeneration, bilateral: Secondary | ICD-10-CM | POA: Diagnosis not present

## 2016-05-19 DIAGNOSIS — H26492 Other secondary cataract, left eye: Secondary | ICD-10-CM | POA: Diagnosis not present

## 2016-06-03 ENCOUNTER — Other Ambulatory Visit: Payer: Self-pay | Admitting: Urology

## 2016-06-03 DIAGNOSIS — C61 Malignant neoplasm of prostate: Secondary | ICD-10-CM

## 2016-06-16 DIAGNOSIS — H26491 Other secondary cataract, right eye: Secondary | ICD-10-CM | POA: Diagnosis not present

## 2016-06-23 ENCOUNTER — Ambulatory Visit (HOSPITAL_COMMUNITY)
Admission: RE | Admit: 2016-06-23 | Discharge: 2016-06-23 | Disposition: A | Discharge status: Home / Self Care | Payer: Medicare Other | Source: Ambulatory Visit | Attending: Urology | Admitting: Urology

## 2016-06-23 DIAGNOSIS — K5730 Diverticulosis of large intestine without perforation or abscess without bleeding: Secondary | ICD-10-CM | POA: Insufficient documentation

## 2016-06-23 DIAGNOSIS — C61 Malignant neoplasm of prostate: Secondary | ICD-10-CM | POA: Insufficient documentation

## 2016-06-23 LAB — POCT I-STAT CREATININE: Creatinine, Ser: 1.3 mg/dL — ABNORMAL HIGH (ref 0.61–1.24)

## 2016-06-23 MED ORDER — GADOBENATE DIMEGLUMINE 529 MG/ML IV SOLN
20.0000 mL | Freq: Once | INTRAVENOUS | Status: AC | PRN
  Administered 2016-06-23: 16 mL via INTRAVENOUS

## 2016-07-13 DIAGNOSIS — C61 Malignant neoplasm of prostate: Secondary | ICD-10-CM | POA: Diagnosis not present

## 2016-09-05 DIAGNOSIS — Z1389 Encounter for screening for other disorder: Secondary | ICD-10-CM | POA: Diagnosis not present

## 2016-09-05 DIAGNOSIS — N183 Chronic kidney disease, stage 3 (moderate): Secondary | ICD-10-CM | POA: Diagnosis not present

## 2016-09-05 DIAGNOSIS — M1000 Idiopathic gout, unspecified site: Secondary | ICD-10-CM | POA: Diagnosis not present

## 2016-09-05 DIAGNOSIS — C61 Malignant neoplasm of prostate: Secondary | ICD-10-CM | POA: Diagnosis not present

## 2016-09-05 DIAGNOSIS — K5730 Diverticulosis of large intestine without perforation or abscess without bleeding: Secondary | ICD-10-CM | POA: Diagnosis not present

## 2016-09-05 DIAGNOSIS — R030 Elevated blood-pressure reading, without diagnosis of hypertension: Secondary | ICD-10-CM | POA: Diagnosis not present

## 2016-09-05 DIAGNOSIS — Z0000 Encounter for general adult medical examination without abnormal findings: Secondary | ICD-10-CM | POA: Diagnosis not present

## 2016-09-05 DIAGNOSIS — Z79899 Other long term (current) drug therapy: Secondary | ICD-10-CM | POA: Diagnosis not present

## 2016-09-05 DIAGNOSIS — N529 Male erectile dysfunction, unspecified: Secondary | ICD-10-CM | POA: Diagnosis not present

## 2016-09-05 DIAGNOSIS — R197 Diarrhea, unspecified: Secondary | ICD-10-CM | POA: Diagnosis not present

## 2016-09-05 DIAGNOSIS — E559 Vitamin D deficiency, unspecified: Secondary | ICD-10-CM | POA: Diagnosis not present

## 2016-09-05 DIAGNOSIS — E782 Mixed hyperlipidemia: Secondary | ICD-10-CM | POA: Diagnosis not present

## 2016-11-10 DIAGNOSIS — D225 Melanocytic nevi of trunk: Secondary | ICD-10-CM | POA: Diagnosis not present

## 2016-11-10 DIAGNOSIS — Z85828 Personal history of other malignant neoplasm of skin: Secondary | ICD-10-CM | POA: Diagnosis not present

## 2016-11-10 DIAGNOSIS — L821 Other seborrheic keratosis: Secondary | ICD-10-CM | POA: Diagnosis not present

## 2016-11-10 DIAGNOSIS — L812 Freckles: Secondary | ICD-10-CM | POA: Diagnosis not present

## 2016-11-10 DIAGNOSIS — D1801 Hemangioma of skin and subcutaneous tissue: Secondary | ICD-10-CM | POA: Diagnosis not present

## 2016-11-10 DIAGNOSIS — L570 Actinic keratosis: Secondary | ICD-10-CM | POA: Diagnosis not present

## 2016-11-15 DIAGNOSIS — H43813 Vitreous degeneration, bilateral: Secondary | ICD-10-CM | POA: Diagnosis not present

## 2016-11-15 DIAGNOSIS — H35373 Puckering of macula, bilateral: Secondary | ICD-10-CM | POA: Diagnosis not present

## 2017-01-11 DIAGNOSIS — C61 Malignant neoplasm of prostate: Secondary | ICD-10-CM | POA: Diagnosis not present

## 2017-01-18 DIAGNOSIS — C61 Malignant neoplasm of prostate: Secondary | ICD-10-CM | POA: Diagnosis not present

## 2017-01-18 DIAGNOSIS — N486 Induration penis plastica: Secondary | ICD-10-CM | POA: Diagnosis not present

## 2017-04-05 DIAGNOSIS — Z23 Encounter for immunization: Secondary | ICD-10-CM | POA: Diagnosis not present

## 2017-07-13 DIAGNOSIS — H524 Presbyopia: Secondary | ICD-10-CM | POA: Diagnosis not present

## 2017-07-13 DIAGNOSIS — H35372 Puckering of macula, left eye: Secondary | ICD-10-CM | POA: Diagnosis not present

## 2017-07-13 DIAGNOSIS — H26491 Other secondary cataract, right eye: Secondary | ICD-10-CM | POA: Diagnosis not present

## 2017-07-26 DIAGNOSIS — C61 Malignant neoplasm of prostate: Secondary | ICD-10-CM | POA: Diagnosis not present

## 2017-08-02 DIAGNOSIS — C61 Malignant neoplasm of prostate: Secondary | ICD-10-CM | POA: Diagnosis not present

## 2017-08-02 DIAGNOSIS — N5201 Erectile dysfunction due to arterial insufficiency: Secondary | ICD-10-CM | POA: Diagnosis not present

## 2017-09-13 DIAGNOSIS — E782 Mixed hyperlipidemia: Secondary | ICD-10-CM | POA: Diagnosis not present

## 2017-09-13 DIAGNOSIS — Z1389 Encounter for screening for other disorder: Secondary | ICD-10-CM | POA: Diagnosis not present

## 2017-09-13 DIAGNOSIS — M1000 Idiopathic gout, unspecified site: Secondary | ICD-10-CM | POA: Diagnosis not present

## 2017-09-13 DIAGNOSIS — C61 Malignant neoplasm of prostate: Secondary | ICD-10-CM | POA: Diagnosis not present

## 2017-09-13 DIAGNOSIS — K5730 Diverticulosis of large intestine without perforation or abscess without bleeding: Secondary | ICD-10-CM | POA: Diagnosis not present

## 2017-09-13 DIAGNOSIS — N183 Chronic kidney disease, stage 3 (moderate): Secondary | ICD-10-CM | POA: Diagnosis not present

## 2017-09-13 DIAGNOSIS — Z79899 Other long term (current) drug therapy: Secondary | ICD-10-CM | POA: Diagnosis not present

## 2017-09-13 DIAGNOSIS — N529 Male erectile dysfunction, unspecified: Secondary | ICD-10-CM | POA: Diagnosis not present

## 2017-09-13 DIAGNOSIS — Z0000 Encounter for general adult medical examination without abnormal findings: Secondary | ICD-10-CM | POA: Diagnosis not present

## 2017-09-13 DIAGNOSIS — R030 Elevated blood-pressure reading, without diagnosis of hypertension: Secondary | ICD-10-CM | POA: Diagnosis not present

## 2017-09-13 DIAGNOSIS — E559 Vitamin D deficiency, unspecified: Secondary | ICD-10-CM | POA: Diagnosis not present

## 2017-11-16 DIAGNOSIS — L812 Freckles: Secondary | ICD-10-CM | POA: Diagnosis not present

## 2017-11-16 DIAGNOSIS — L570 Actinic keratosis: Secondary | ICD-10-CM | POA: Diagnosis not present

## 2017-11-16 DIAGNOSIS — D2271 Melanocytic nevi of right lower limb, including hip: Secondary | ICD-10-CM | POA: Diagnosis not present

## 2017-11-16 DIAGNOSIS — L821 Other seborrheic keratosis: Secondary | ICD-10-CM | POA: Diagnosis not present

## 2017-11-16 DIAGNOSIS — D1801 Hemangioma of skin and subcutaneous tissue: Secondary | ICD-10-CM | POA: Diagnosis not present

## 2017-11-16 DIAGNOSIS — D225 Melanocytic nevi of trunk: Secondary | ICD-10-CM | POA: Diagnosis not present

## 2017-11-16 DIAGNOSIS — Z85828 Personal history of other malignant neoplasm of skin: Secondary | ICD-10-CM | POA: Diagnosis not present

## 2017-11-20 DIAGNOSIS — M25552 Pain in left hip: Secondary | ICD-10-CM | POA: Diagnosis not present

## 2017-11-20 DIAGNOSIS — M25551 Pain in right hip: Secondary | ICD-10-CM | POA: Diagnosis not present

## 2017-11-29 DIAGNOSIS — M1611 Unilateral primary osteoarthritis, right hip: Secondary | ICD-10-CM | POA: Insufficient documentation

## 2017-11-29 DIAGNOSIS — M1612 Unilateral primary osteoarthritis, left hip: Secondary | ICD-10-CM | POA: Diagnosis not present

## 2017-12-28 DIAGNOSIS — M1612 Unilateral primary osteoarthritis, left hip: Secondary | ICD-10-CM | POA: Diagnosis not present

## 2017-12-28 DIAGNOSIS — M160 Bilateral primary osteoarthritis of hip: Secondary | ICD-10-CM | POA: Diagnosis not present

## 2017-12-28 DIAGNOSIS — M1611 Unilateral primary osteoarthritis, right hip: Secondary | ICD-10-CM | POA: Diagnosis not present

## 2018-01-02 DIAGNOSIS — H35373 Puckering of macula, bilateral: Secondary | ICD-10-CM | POA: Diagnosis not present

## 2018-01-02 DIAGNOSIS — H43813 Vitreous degeneration, bilateral: Secondary | ICD-10-CM | POA: Diagnosis not present

## 2018-01-02 DIAGNOSIS — H3581 Retinal edema: Secondary | ICD-10-CM | POA: Diagnosis not present

## 2018-01-15 DIAGNOSIS — L409 Psoriasis, unspecified: Secondary | ICD-10-CM | POA: Diagnosis not present

## 2018-01-15 DIAGNOSIS — M19041 Primary osteoarthritis, right hand: Secondary | ICD-10-CM | POA: Diagnosis not present

## 2018-01-15 DIAGNOSIS — M79673 Pain in unspecified foot: Secondary | ICD-10-CM | POA: Diagnosis not present

## 2018-01-15 DIAGNOSIS — M19071 Primary osteoarthritis, right ankle and foot: Secondary | ICD-10-CM | POA: Diagnosis not present

## 2018-01-15 DIAGNOSIS — M79641 Pain in right hand: Secondary | ICD-10-CM | POA: Diagnosis not present

## 2018-01-15 DIAGNOSIS — M79642 Pain in left hand: Secondary | ICD-10-CM | POA: Diagnosis not present

## 2018-01-15 DIAGNOSIS — M1990 Unspecified osteoarthritis, unspecified site: Secondary | ICD-10-CM | POA: Diagnosis not present

## 2018-01-15 DIAGNOSIS — M353 Polymyalgia rheumatica: Secondary | ICD-10-CM | POA: Diagnosis not present

## 2018-01-15 DIAGNOSIS — M19042 Primary osteoarthritis, left hand: Secondary | ICD-10-CM | POA: Diagnosis not present

## 2018-01-15 DIAGNOSIS — Z8739 Personal history of other diseases of the musculoskeletal system and connective tissue: Secondary | ICD-10-CM | POA: Diagnosis not present

## 2018-01-15 DIAGNOSIS — M19072 Primary osteoarthritis, left ankle and foot: Secondary | ICD-10-CM | POA: Diagnosis not present

## 2018-01-29 DIAGNOSIS — Z8739 Personal history of other diseases of the musculoskeletal system and connective tissue: Secondary | ICD-10-CM | POA: Diagnosis not present

## 2018-01-29 DIAGNOSIS — M353 Polymyalgia rheumatica: Secondary | ICD-10-CM | POA: Diagnosis not present

## 2018-01-29 DIAGNOSIS — L409 Psoriasis, unspecified: Secondary | ICD-10-CM | POA: Diagnosis not present

## 2018-01-29 DIAGNOSIS — M1990 Unspecified osteoarthritis, unspecified site: Secondary | ICD-10-CM | POA: Diagnosis not present

## 2018-01-31 DIAGNOSIS — C61 Malignant neoplasm of prostate: Secondary | ICD-10-CM | POA: Diagnosis not present

## 2018-02-07 DIAGNOSIS — N5201 Erectile dysfunction due to arterial insufficiency: Secondary | ICD-10-CM | POA: Diagnosis not present

## 2018-02-07 DIAGNOSIS — C61 Malignant neoplasm of prostate: Secondary | ICD-10-CM | POA: Diagnosis not present

## 2018-02-23 DIAGNOSIS — Z8739 Personal history of other diseases of the musculoskeletal system and connective tissue: Secondary | ICD-10-CM | POA: Diagnosis not present

## 2018-02-23 DIAGNOSIS — L409 Psoriasis, unspecified: Secondary | ICD-10-CM | POA: Diagnosis not present

## 2018-02-23 DIAGNOSIS — M1990 Unspecified osteoarthritis, unspecified site: Secondary | ICD-10-CM | POA: Diagnosis not present

## 2018-02-23 DIAGNOSIS — M353 Polymyalgia rheumatica: Secondary | ICD-10-CM | POA: Diagnosis not present

## 2018-02-23 DIAGNOSIS — Z79899 Other long term (current) drug therapy: Secondary | ICD-10-CM | POA: Diagnosis not present

## 2018-03-28 DIAGNOSIS — M5416 Radiculopathy, lumbar region: Secondary | ICD-10-CM | POA: Diagnosis not present

## 2018-05-03 DIAGNOSIS — Z23 Encounter for immunization: Secondary | ICD-10-CM | POA: Diagnosis not present

## 2018-05-10 DIAGNOSIS — C61 Malignant neoplasm of prostate: Secondary | ICD-10-CM | POA: Diagnosis not present

## 2018-05-15 ENCOUNTER — Other Ambulatory Visit: Payer: Self-pay | Admitting: Urology

## 2018-05-15 DIAGNOSIS — C61 Malignant neoplasm of prostate: Secondary | ICD-10-CM

## 2018-05-30 ENCOUNTER — Ambulatory Visit
Admission: RE | Admit: 2018-05-30 | Discharge: 2018-05-30 | Disposition: A | Discharge status: Home / Self Care | Payer: Medicare Other | Source: Ambulatory Visit | Attending: Urology | Admitting: Urology

## 2018-05-30 DIAGNOSIS — C61 Malignant neoplasm of prostate: Secondary | ICD-10-CM | POA: Diagnosis not present

## 2018-05-30 MED ORDER — GADOBENATE DIMEGLUMINE 529 MG/ML IV SOLN
15.0000 mL | Freq: Once | INTRAVENOUS | Status: AC | PRN
  Administered 2018-05-30: 15 mL via INTRAVENOUS

## 2018-07-03 DIAGNOSIS — L409 Psoriasis, unspecified: Secondary | ICD-10-CM | POA: Diagnosis not present

## 2018-07-03 DIAGNOSIS — Z79899 Other long term (current) drug therapy: Secondary | ICD-10-CM | POA: Diagnosis not present

## 2018-07-03 DIAGNOSIS — Z8739 Personal history of other diseases of the musculoskeletal system and connective tissue: Secondary | ICD-10-CM | POA: Diagnosis not present

## 2018-07-03 DIAGNOSIS — M353 Polymyalgia rheumatica: Secondary | ICD-10-CM | POA: Diagnosis not present

## 2018-07-03 DIAGNOSIS — M1990 Unspecified osteoarthritis, unspecified site: Secondary | ICD-10-CM | POA: Diagnosis not present

## 2018-07-13 DIAGNOSIS — C61 Malignant neoplasm of prostate: Secondary | ICD-10-CM | POA: Diagnosis not present

## 2018-08-02 DIAGNOSIS — H35372 Puckering of macula, left eye: Secondary | ICD-10-CM | POA: Diagnosis not present

## 2018-08-02 DIAGNOSIS — H524 Presbyopia: Secondary | ICD-10-CM | POA: Diagnosis not present

## 2018-08-02 DIAGNOSIS — Z961 Presence of intraocular lens: Secondary | ICD-10-CM | POA: Diagnosis not present

## 2018-08-27 DIAGNOSIS — N5201 Erectile dysfunction due to arterial insufficiency: Secondary | ICD-10-CM | POA: Diagnosis not present

## 2018-08-27 DIAGNOSIS — C61 Malignant neoplasm of prostate: Secondary | ICD-10-CM | POA: Diagnosis not present

## 2018-10-23 DIAGNOSIS — M1990 Unspecified osteoarthritis, unspecified site: Secondary | ICD-10-CM | POA: Diagnosis not present

## 2018-10-23 DIAGNOSIS — Z79899 Other long term (current) drug therapy: Secondary | ICD-10-CM | POA: Diagnosis not present

## 2018-10-23 DIAGNOSIS — Z8739 Personal history of other diseases of the musculoskeletal system and connective tissue: Secondary | ICD-10-CM | POA: Diagnosis not present

## 2018-10-23 DIAGNOSIS — L409 Psoriasis, unspecified: Secondary | ICD-10-CM | POA: Diagnosis not present

## 2018-10-23 DIAGNOSIS — M353 Polymyalgia rheumatica: Secondary | ICD-10-CM | POA: Diagnosis not present

## 2018-11-22 DIAGNOSIS — Z85828 Personal history of other malignant neoplasm of skin: Secondary | ICD-10-CM | POA: Diagnosis not present

## 2018-11-22 DIAGNOSIS — L821 Other seborrheic keratosis: Secondary | ICD-10-CM | POA: Diagnosis not present

## 2018-11-22 DIAGNOSIS — L812 Freckles: Secondary | ICD-10-CM | POA: Diagnosis not present

## 2018-11-22 DIAGNOSIS — D1801 Hemangioma of skin and subcutaneous tissue: Secondary | ICD-10-CM | POA: Diagnosis not present

## 2018-11-22 DIAGNOSIS — L570 Actinic keratosis: Secondary | ICD-10-CM | POA: Diagnosis not present

## 2019-01-01 DIAGNOSIS — Z8739 Personal history of other diseases of the musculoskeletal system and connective tissue: Secondary | ICD-10-CM | POA: Diagnosis not present

## 2019-01-01 DIAGNOSIS — S93401A Sprain of unspecified ligament of right ankle, initial encounter: Secondary | ICD-10-CM | POA: Diagnosis not present

## 2019-01-01 DIAGNOSIS — Z79899 Other long term (current) drug therapy: Secondary | ICD-10-CM | POA: Diagnosis not present

## 2019-01-01 DIAGNOSIS — L409 Psoriasis, unspecified: Secondary | ICD-10-CM | POA: Diagnosis not present

## 2019-01-01 DIAGNOSIS — S8261XA Displaced fracture of lateral malleolus of right fibula, initial encounter for closed fracture: Secondary | ICD-10-CM | POA: Diagnosis not present

## 2019-01-01 DIAGNOSIS — M1990 Unspecified osteoarthritis, unspecified site: Secondary | ICD-10-CM | POA: Diagnosis not present

## 2019-01-01 DIAGNOSIS — M353 Polymyalgia rheumatica: Secondary | ICD-10-CM | POA: Diagnosis not present

## 2019-01-01 DIAGNOSIS — M25571 Pain in right ankle and joints of right foot: Secondary | ICD-10-CM | POA: Diagnosis not present

## 2019-02-13 DIAGNOSIS — C61 Malignant neoplasm of prostate: Secondary | ICD-10-CM | POA: Diagnosis not present

## 2019-02-18 ENCOUNTER — Ambulatory Visit (INDEPENDENT_AMBULATORY_CARE_PROVIDER_SITE_OTHER): Payer: Medicare Other | Admitting: Orthopedic Surgery

## 2019-02-18 ENCOUNTER — Ambulatory Visit (INDEPENDENT_AMBULATORY_CARE_PROVIDER_SITE_OTHER): Payer: Medicare Other

## 2019-02-18 ENCOUNTER — Encounter: Payer: Self-pay | Admitting: Orthopedic Surgery

## 2019-02-18 VITALS — Ht 68.0 in | Wt 177.0 lb

## 2019-02-18 DIAGNOSIS — M79671 Pain in right foot: Secondary | ICD-10-CM | POA: Diagnosis not present

## 2019-02-18 DIAGNOSIS — M722 Plantar fascial fibromatosis: Secondary | ICD-10-CM | POA: Diagnosis not present

## 2019-02-18 NOTE — Progress Notes (Signed)
Office Visit Note   Patient: Travis Hendricks           Date of Birth: Nov 09, 1940           MRN: [id number] Visit Date: 02/18/2019              Requested by: [name], MD [address] [place],  [place] [address] PCP: [name], MD  Chief Complaint  Patient presents with  . Right Foot - Pain      HPI: Patient is a 78 year old gentleman who presents with 1 year history of pain over the origin of the plantar fascia.  Patient states that he did injure his ankle about 6 weeks ago but states that that is doing fine.  Patient has start up pain on the origin of the plantar fascia.  Assessment & Plan: Visit Diagnoses:  1. Pain in right foot   2. Plantar fasciitis, right     Plan: Recommended Achilles stretching this was demonstrated to him.  Also recommended a stiff soled sneaker with firm orthotics.  Discussed that he could also try a carbon fiber plate.  Also reviewed collagen strengthening.  Follow-Up Instructions: Return if symptoms worsen or fail to improve.   Ortho Exam  Patient is alert, oriented, no adenopathy, well-dressed, normal affect, normal respiratory effort. Examination patient is a good dorsalis pedis pulse he has dorsiflexion only to neutral of the ankle.  He is point tender to palpation of the origin plantar fascia tarsal tunnel is nontender to palpation lateral compression of the calcaneus is nontender to palpation.  His ankle radiographs were reviewed from July no signs of a fracture.  Patient is ankle is asymptomatic today.  Imaging: Xr Foot 2 Views Right  Result Date: 02/18/2019 2 view radiographs of the right foot shows some degenerative changes of the MTP joint of the great toe.  There is some calcification of the arteries down to the ankle.  No evidence of a calcaneal fracture.  No images are attached to the encounter.  Labs: Lab Results  Component Value Date   HGBA1C 5.7 06/28/2011   HGBA1C 5.9 06/24/2008     Lab Results  Component  Value Date   ALBUMIN 3.8 06/28/2011   ALBUMIN 3.6 06/18/2010   ALBUMIN 3.7 06/01/2009    No results found for: MG No results found for: VD25OH  No results found for: PREALBUMIN CBC EXTENDED Latest Ref Rng & Units 06/28/2011 06/18/2010 06/01/2009  WBC 4.5 - 10.5 K/uL 4.6 5.8 5.6  RBC 4.22 - 5.81 Mil/uL 4.65 4.82 4.79  HGB 13.0 - 17.0 g/dL 15.1 15.4 15.4  HCT 39.0 - 52.0 % 44.4 45.6 45.6  PLT 150.0 - 400.0 K/uL 235.0 236.0 239.0  NEUTROABS 1.4 - 7.7 K/uL 2.0 2.4 2.5  LYMPHSABS 0.7 - 4.0 K/uL 1.9 2.5 2.1     Body mass index is 26.91 kg/m.  Orders:  Orders Placed This Encounter  Procedures  . XR Foot 2 Views Right   No orders of the defined types were placed in this encounter.    Procedures: No procedures performed  Clinical Data: No additional findings.  ROS:  All other systems negative, except as noted in the HPI. Review of Systems  Objective: Vital Signs: Ht 5\' 8"  (1.727 m)   Wt 177 lb (80.3 kg)   BMI 26.91 kg/m   Specialty Comments:  No specialty comments available.  PMFS History: Patient Active Problem List   Diagnosis Date Noted  . Basal cell carcinoma  11/25/2015  . 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 DIVERTICULOSIS, COLON 06/09/2008  . LOW BACK PAIN, CHRONIC 06/09/2008  . HYPERLIPIDEMIA 07/21/2006  . GILBERT'S SYNDROME 07/21/2006   Past Medical History:  Diagnosis Date  . Basal cell carcinoma   . Diverticulosis   . Other and unspecified hyperlipidemia     Family History  Problem Relation Age of Onset  . Heart attack Father   . Cancer Mother        lung  . Cancer Brother        cancer    Past Surgical History:  Procedure Laterality Date  . CATARACT EXTRACTION W/ INTRAOCULAR LENS IMPLANT     bilaterally  . COLONOSCOPY     Tics  . ELBOW SURGERY     torn ligament  . HERNIA REPAIR    . KNEE ARTHROSCOPY    . VASECTOMY     Social History   Occupational History  . Not on file  Tobacco Use  . Smoking status: Never Smoker  .  Smokeless tobacco: Never Used  Substance and Sexual Activity  . Alcohol use: Yes    Alcohol/week: 14.0 standard drinks    Types: 7 Shots of liquor, 7 Glasses of wine per week  . Drug use: No  . Sexual activity: Not on file

## 2019-02-20 DIAGNOSIS — N5201 Erectile dysfunction due to arterial insufficiency: Secondary | ICD-10-CM | POA: Diagnosis not present

## 2019-02-20 DIAGNOSIS — C61 Malignant neoplasm of prostate: Secondary | ICD-10-CM | POA: Diagnosis not present

## 2019-02-26 DIAGNOSIS — Z23 Encounter for immunization: Secondary | ICD-10-CM | POA: Diagnosis not present

## 2019-03-27 DIAGNOSIS — M353 Polymyalgia rheumatica: Secondary | ICD-10-CM | POA: Diagnosis not present

## 2019-03-27 DIAGNOSIS — M1000 Idiopathic gout, unspecified site: Secondary | ICD-10-CM | POA: Diagnosis not present

## 2019-03-27 DIAGNOSIS — Z0000 Encounter for general adult medical examination without abnormal findings: Secondary | ICD-10-CM | POA: Diagnosis not present

## 2019-03-27 DIAGNOSIS — N529 Male erectile dysfunction, unspecified: Secondary | ICD-10-CM | POA: Diagnosis not present

## 2019-03-27 DIAGNOSIS — Z23 Encounter for immunization: Secondary | ICD-10-CM | POA: Diagnosis not present

## 2019-03-27 DIAGNOSIS — Z1389 Encounter for screening for other disorder: Secondary | ICD-10-CM | POA: Diagnosis not present

## 2019-03-27 DIAGNOSIS — E559 Vitamin D deficiency, unspecified: Secondary | ICD-10-CM | POA: Diagnosis not present

## 2019-03-27 DIAGNOSIS — E782 Mixed hyperlipidemia: Secondary | ICD-10-CM | POA: Diagnosis not present

## 2019-03-27 DIAGNOSIS — C61 Malignant neoplasm of prostate: Secondary | ICD-10-CM | POA: Diagnosis not present

## 2019-03-27 DIAGNOSIS — Z79899 Other long term (current) drug therapy: Secondary | ICD-10-CM | POA: Diagnosis not present

## 2019-04-04 DIAGNOSIS — L409 Psoriasis, unspecified: Secondary | ICD-10-CM | POA: Diagnosis not present

## 2019-04-04 DIAGNOSIS — Z79899 Other long term (current) drug therapy: Secondary | ICD-10-CM | POA: Diagnosis not present

## 2019-04-04 DIAGNOSIS — M549 Dorsalgia, unspecified: Secondary | ICD-10-CM | POA: Diagnosis not present

## 2019-04-04 DIAGNOSIS — M47816 Spondylosis without myelopathy or radiculopathy, lumbar region: Secondary | ICD-10-CM | POA: Diagnosis not present

## 2019-04-04 DIAGNOSIS — M353 Polymyalgia rheumatica: Secondary | ICD-10-CM | POA: Diagnosis not present

## 2019-04-04 DIAGNOSIS — M160 Bilateral primary osteoarthritis of hip: Secondary | ICD-10-CM | POA: Diagnosis not present

## 2019-04-04 DIAGNOSIS — M1990 Unspecified osteoarthritis, unspecified site: Secondary | ICD-10-CM | POA: Diagnosis not present

## 2019-04-04 DIAGNOSIS — Z8739 Personal history of other diseases of the musculoskeletal system and connective tissue: Secondary | ICD-10-CM | POA: Diagnosis not present

## 2019-04-04 DIAGNOSIS — Z8546 Personal history of malignant neoplasm of prostate: Secondary | ICD-10-CM | POA: Diagnosis not present

## 2019-04-04 DIAGNOSIS — M25559 Pain in unspecified hip: Secondary | ICD-10-CM | POA: Diagnosis not present

## 2019-04-08 DIAGNOSIS — L409 Psoriasis, unspecified: Secondary | ICD-10-CM | POA: Diagnosis not present

## 2019-04-08 DIAGNOSIS — M1990 Unspecified osteoarthritis, unspecified site: Secondary | ICD-10-CM | POA: Diagnosis not present

## 2019-04-08 DIAGNOSIS — Z79899 Other long term (current) drug therapy: Secondary | ICD-10-CM | POA: Diagnosis not present

## 2019-04-08 DIAGNOSIS — M25559 Pain in unspecified hip: Secondary | ICD-10-CM | POA: Diagnosis not present

## 2019-04-08 DIAGNOSIS — Z8546 Personal history of malignant neoplasm of prostate: Secondary | ICD-10-CM | POA: Diagnosis not present

## 2019-04-08 DIAGNOSIS — M353 Polymyalgia rheumatica: Secondary | ICD-10-CM | POA: Diagnosis not present

## 2019-04-08 DIAGNOSIS — M549 Dorsalgia, unspecified: Secondary | ICD-10-CM | POA: Diagnosis not present

## 2019-04-08 DIAGNOSIS — Z8739 Personal history of other diseases of the musculoskeletal system and connective tissue: Secondary | ICD-10-CM | POA: Diagnosis not present

## 2019-05-20 ENCOUNTER — Ambulatory Visit (INDEPENDENT_AMBULATORY_CARE_PROVIDER_SITE_OTHER): Payer: Medicare Other

## 2019-05-20 ENCOUNTER — Other Ambulatory Visit: Payer: Self-pay

## 2019-05-20 ENCOUNTER — Ambulatory Visit (INDEPENDENT_AMBULATORY_CARE_PROVIDER_SITE_OTHER): Payer: Medicare Other | Admitting: Orthopedic Surgery

## 2019-05-20 ENCOUNTER — Encounter: Payer: Self-pay | Admitting: Orthopedic Surgery

## 2019-05-20 VITALS — Ht 68.0 in | Wt 177.0 lb

## 2019-05-20 DIAGNOSIS — G8929 Other chronic pain: Secondary | ICD-10-CM

## 2019-05-20 DIAGNOSIS — L03032 Cellulitis of left toe: Secondary | ICD-10-CM | POA: Diagnosis not present

## 2019-05-20 DIAGNOSIS — L600 Ingrowing nail: Secondary | ICD-10-CM

## 2019-05-20 DIAGNOSIS — M5410 Radiculopathy, site unspecified: Secondary | ICD-10-CM

## 2019-05-20 DIAGNOSIS — M5441 Lumbago with sciatica, right side: Secondary | ICD-10-CM | POA: Diagnosis not present

## 2019-05-20 MED ORDER — OXYCODONE-ACETAMINOPHEN 5-325 MG PO TABS: 1.0000 | ORAL_TABLET | ORAL | 0 refills | Status: DC | PRN

## 2019-05-20 NOTE — Progress Notes (Signed)
Office Visit Note   Patient: Travis Hendricks           Date of Birth: 04-26-1941           MRN: [id number] Visit Date: 05/20/2019              Requested by: [name], MD [address] [place],  [place] [address] PCP: [name], MD  Chief Complaint  Patient presents with  . Left Foot - Pain      HPI: Patient is a 78 year old gentleman who presents painful paronychial infection left great toenail as well as right lower extremity radicular pain.  Patient states he is able to play pickle ball able to hike without any back or radicular symptoms however after sitting for short periods of time he has increased pain that radiates down the lateral aspect of his right thigh.  Patient complains of a painful paronychial infection left great toenail he states his wife has tried to trim it.  Patient states he has a history of polymyalgia rheumatica he is currently on 4 mg of prednisone every other day.  Patient states that his back symptoms were unaffected by the prednisone however his axial weakness has improved significantly.  Assessment & Plan: Visit Diagnoses:  1. Chronic right-sided low back pain with right-sided sciatica   2. Back pain with right-sided radiculopathy   3. Paronychia of great toe of left foot   4. Ingrowing nail, left great toe     Plan: We will use anti-inflammatories for the right-sided radicular pain postoperative shoe and begin dressing changes tomorrow for the partial nail excision.  We will set the patient up for physical therapy and follow-up after he has completed therapy for core strengthening for his back.  Follow-Up Instructions: No follow-ups on file.   Ortho Exam  Patient is alert, oriented, no adenopathy, well-dressed, normal affect, normal respiratory effort. Examination patient has a good pulse.  Patient has redness over the lateral border of the left great toe.  Patient has tenderness to palpation consistent with a paronychial infection.   The toenail is ingrown.  Examination is a negative straight leg raise bilaterally with no focal motor weakness in either lower extremity radicular symptoms down the right lateral thigh but a negative straight leg raise.  Imaging: No results found. No images are attached to the encounter.  Labs: Lab Results  Component Value Date   HGBA1C 5.7 06/28/2011   HGBA1C 5.9 06/24/2008     Lab Results  Component Value Date   ALBUMIN 3.8 06/28/2011   ALBUMIN 3.6 06/18/2010   ALBUMIN 3.7 06/01/2009    No results found for: MG No results found for: VD25OH  No results found for: PREALBUMIN CBC EXTENDED Latest Ref Rng & Units 06/28/2011 06/18/2010 06/01/2009  WBC 4.5 - 10.5 K/uL 4.6 5.8 5.6  RBC 4.22 - 5.81 Mil/uL 4.65 4.82 4.79  HGB 13.0 - 17.0 g/dL 15.1 15.4 15.4  HCT 39.0 - 52.0 % 44.4 45.6 45.6  PLT 150.0 - 400.0 K/uL 235.0 236.0 239.0  NEUTROABS 1.4 - 7.7 K/uL 2.0 2.4 2.5  LYMPHSABS 0.7 - 4.0 K/uL 1.9 2.5 2.1     Body mass index is 26.91 kg/m.  Orders:  Orders Placed This Encounter  Procedures  . XR Lumbar Spine 2-3 Views   Meds ordered this encounter  Medications  . oxyCODONE-acetaminophen (PERCOCET/ROXICET) 5-325 MG tablet    Sig: Take 1 tablet by mouth every 4 (four) hours as needed for severe pain.  Dispense:  20 tablet    Refill:  0     Procedures: No procedures performed  Clinical Data: No additional findings.  ROS:  All other systems negative, except as noted in the HPI. Review of Systems  Objective: Vital Signs: Ht 5\' 8"  (1.727 m)   Wt 177 lb (80.3 kg)   BMI 26.91 kg/m   Specialty Comments:  No specialty comments available.  PMFS History: Patient Active Problem List   Diagnosis Date Noted  . Basal cell carcinoma 11/25/2015  . 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 DIVERTICULOSIS, COLON 06/09/2008  . LOW BACK PAIN, CHRONIC 06/09/2008  . HYPERLIPIDEMIA 07/21/2006  . GILBERT'S SYNDROME 07/21/2006   Past Medical History:  Diagnosis Date   . Basal cell carcinoma   . Diverticulosis   . Other and unspecified hyperlipidemia     Family History  Problem Relation Age of Onset  . Heart attack Father   . Cancer Mother        lung  . Cancer Brother        cancer    Past Surgical History:  Procedure Laterality Date  . CATARACT EXTRACTION W/ INTRAOCULAR LENS IMPLANT     bilaterally  . COLONOSCOPY     Tics  . ELBOW SURGERY     torn ligament  . HERNIA REPAIR    . KNEE ARTHROSCOPY    . VASECTOMY     Social History   Occupational History  . Not on file  Tobacco Use  . Smoking status: Never Smoker  . Smokeless tobacco: Never Used  Substance and Sexual Activity  . Alcohol use: Yes    Alcohol/week: 14.0 standard drinks    Types: 7 Shots of liquor, 7 Glasses of wine per week  . Drug use: No  . Sexual activity: Not on file

## 2019-05-20 NOTE — Addendum Note (Signed)
Addended by: Meridee Score on: 05/20/2019 02:08 PM   Modules accepted: Orders

## 2019-06-04 ENCOUNTER — Other Ambulatory Visit: Payer: Self-pay

## 2019-06-04 ENCOUNTER — Ambulatory Visit (INDEPENDENT_AMBULATORY_CARE_PROVIDER_SITE_OTHER): Payer: Medicare Other | Admitting: Physical Therapy

## 2019-06-04 ENCOUNTER — Encounter: Payer: Self-pay | Admitting: Physical Therapy

## 2019-06-04 DIAGNOSIS — M5442 Lumbago with sciatica, left side: Secondary | ICD-10-CM | POA: Diagnosis not present

## 2019-06-04 DIAGNOSIS — G8929 Other chronic pain: Secondary | ICD-10-CM | POA: Diagnosis not present

## 2019-06-04 DIAGNOSIS — R29898 Other symptoms and signs involving the musculoskeletal system: Secondary | ICD-10-CM | POA: Diagnosis not present

## 2019-06-04 DIAGNOSIS — M5441 Lumbago with sciatica, right side: Secondary | ICD-10-CM | POA: Diagnosis not present

## 2019-06-04 NOTE — Patient Instructions (Signed)
Access Code: [email or web address]  URL: [email or web address]  Date: 06/04/2019  Prepared by: Faustino Congress   Exercises Supine Hamstring Stretch with Strap - 3 reps - 1 sets - 30 sec hold - 2x daily - 7x weekly Supine Piriformis Stretch with Foot on Ground - 3 reps - 1 sets - 30 sec hold - 2x daily - 7x weekly Prone on Elbows Stretch - 1 reps - 1 sets - 3 min hold - 2x daily - 7x weekly Prone Press Up on Elbows - 10 reps - 1 sets - 10 sec hold - 2x daily - 7x weekly Supine Posterior Pelvic Tilt - 10 reps - 1 sets - 5 sec hold - 2x daily - 7x weekly

## 2019-06-04 NOTE — Therapy (Signed)
[facility] [address] [place], [place], [address] Phone: [phone]   Fax:  [phone]  Physical Therapy Evaluation  Patient Details  Name: Travis Hendricks MRN: [id number] Date of Birth: 25-Oct-1940 Referring Provider (PT): Newt Minion, MD   Encounter Date: 06/04/2019  PT End of Session - 06/04/19 1307    Visit Number  1    Number of Visits  6    Date for PT Re-Evaluation  07/16/19    PT Start Time  1055    PT Stop Time  1135    PT Time Calculation (min)  40 min    Activity Tolerance  Patient tolerated treatment well    Behavior During Therapy  [facility] for tasks assessed/performed       Past Medical History:  Diagnosis Date  . Basal cell carcinoma   . Diverticulosis   . Other and unspecified hyperlipidemia     Past Surgical History:  Procedure Laterality Date  . CATARACT EXTRACTION W/ INTRAOCULAR LENS IMPLANT     bilaterally  . COLONOSCOPY     Tics  . ELBOW SURGERY     torn ligament  . HERNIA REPAIR    . KNEE ARTHROSCOPY    . VASECTOMY      There were no vitals filed for this visit.   Subjective Assessment - 06/04/19 1058    Subjective  Pt is a 78 y/o male who presents to OPPT for LBP with radiating symptoms into bil LEs (Rt worse than Lt).  Pt states symptoms began about 1 year ago, with exacerbation about 3 months ago.    Limitations  Sitting    How long can you sit comfortably?  1 hour    Patient Stated Goals  to get MRI - needs to be seen by PT for ~ 4 times    Currently in Pain?  Yes    Pain Score  0-No pain   up to 5/10   Pain Location  Buttocks    Pain Orientation  Right;Left;Posterior    Pain Descriptors / Indicators  Aching;Shooting    Pain Type  Chronic pain;Acute pain    Pain Radiating Towards  RLE to just past knee; LLE slightly less    Pain Onset  More than a month ago    Pain Frequency  Intermittent    Aggravating Factors   sitting (cars, at office chair)    Pain Relieving Factors  standing  up/repositioning         [facility] PT Assessment - 06/04/19 1103      Assessment   Medical Diagnosis  Right-sided radicular pain to the lateral right thigh    Referring Provider (PT)  Newt Minion, MD    Onset Date/Surgical Date  --   1 year   Hand Dominance  Right    Next MD Visit  06/24/2019    Prior Therapy  hand therapy      Precautions   Precautions  None      Restrictions   Weight Bearing Restrictions  No      Balance Screen   Has the patient fallen in the past 6 months  No    Has the patient had a decrease in activity level because of a fear of falling?   No    Is the patient reluctant to leave their home because of a fear of falling?   No      Home Film/video editor residence  Living Arrangements  Spouse/significant other    Additional Comments  denies difficulty with stairs      Prior Function   Level of Independence  Independent    Vocation  Retired    Chief Operating Officer, skiing, fly fishing, sailing; exercise: works out with a Clinical research associate 3 days/wk      Cognition   Overall Cognitive Status  Within Functional Limits for tasks assessed      Posture/Postural Control   Posture/Postural Control  Postural limitations    Postural Limitations  Rounded Shoulders      ROM / Strength   AROM / PROM / Strength  AROM;Strength      AROM   AROM Assessment Site  Lumbar    Lumbar Flexion  limited 25%    Lumbar Extension  WNL    Lumbar - Right Side Bend  WNL    Lumbar - Left Side Bend  WNL    Lumbar - Right Rotation  limited 25%    Lumbar - Left Rotation  limited 25%      Strength   Overall Strength  Within functional limits for tasks performed    Strength Assessment Site  --      Flexibility   Soft Tissue Assessment /Muscle Length  yes    Hamstrings  tightness bil    Piriformis  tightness bil      Palpation   Spinal mobility  hypomobile T8-L5    Palpation comment  no active trigger points noted today; reports some pain along piriformis       Special Tests    Special Tests  Lumbar    Lumbar Tests  Straight Leg Raise;Slump Test      Slump test   Findings  Negative      Straight Leg Raise   Findings  Negative                Objective measurements completed on examination: See above findings.      [facility] PT Treatment/Exercise - 06/04/19 1103      Exercises   Exercises  Lumbar      Lumbar Exercises: Stretches   Passive Hamstring Stretch  Right;Left;2 reps;30 seconds    Prone on Elbows Stretch  1 rep;60 seconds   gentle spinal mobilization   Press Ups  3 reps;10 seconds   prone to elbows only   Piriformis Stretch  Right;Left;2 reps;30 seconds      Lumbar Exercises: Supine   Ab Set  5 reps;5 seconds    AB Set Limitations  discussed performing with all core exercise at the gym             PT Education - 06/04/19 1306    Education Details  HEP    Person(s) Educated  Patient    Methods  Explanation;Demonstration;Handout    Comprehension  Verbalized understanding;Returned demonstration;Need further instruction          PT Long Term Goals - 06/04/19 1310      PT LONG TERM GOAL #1   Title  independent with HEP    Status  New    Target Date  07/16/19      PT LONG TERM GOAL #2   Title  report pain < 3/10 for improved function    Status  New    Target Date  07/16/19      PT LONG TERM GOAL #3   Title  report ability to sit > 2 hours for improved driving tolerance and decreased  pain    Status  New    Target Date  07/16/19             Plan - 06/04/19 1307    Clinical Impression Statement  Pt is a 78 y/o male who presents to OPPT for Rt sided radicular pain x 1 year, with exacerbation x 2-3 months.  Exam generally negative today, although he does demonstrate decreased flexibility and hypomobility of spine.  Pt very active and discussed exercises to incorporate into current routing mainly including stretching and core strengthening.  Will benefit from PT to address deficits  listed.    Examination-Activity Limitations  Sit    Examination-Participation Restrictions  Driving    Stability/Clinical Decision Making  Stable/Uncomplicated    Clinical Decision Making  Moderate    Rehab Potential  Good    PT Frequency  1x / week    PT Duration  6 weeks    PT Treatment/Interventions  ADLs/Self Care Home Management;Cryotherapy;Electrical Stimulation;Ultrasound;Traction;Moist Heat;Gait training;Stair training;Functional mobility training;Therapeutic activities;Therapeutic exercise;Patient/family education;Manual techniques;Taping;Dry needling    PT Next Visit Plan  review HEP, add core exercises, additional stretches    PT Home Exercise Plan  Access Code: [email or web address]    Consulted and Agree with Plan of Care  Patient       Patient will benefit from skilled therapeutic intervention in order to improve the following deficits and impairments:  Pain, Postural dysfunction, Hypomobility, Decreased range of motion, Impaired flexibility  Visit Diagnosis: Chronic bilateral low back pain with bilateral sciatica - Plan: PT plan of care cert/re-cert  Other symptoms and signs involving the musculoskeletal system - Plan: PT plan of care cert/re-cert     Problem List Patient Active Problem List   Diagnosis Date Noted  . Basal cell carcinoma 11/25/2015  . 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 DIVERTICULOSIS, COLON 06/09/2008  . LOW BACK PAIN, CHRONIC 06/09/2008  . HYPERLIPIDEMIA 07/21/2006  . [facility] SYNDROME 07/21/2006         Laureen Abrahams, PT, DPT 06/04/19 1:13 PM      [facility] [address] [place], [place], [address] Phone: [phone]   Fax:  [phone]  Name: XZAYDEN LONGHURST MRN: [id number] Date of Birth: Nov 02, 1940

## 2019-06-12 ENCOUNTER — Encounter: Payer: Medicare Other | Admitting: Physical Therapy

## 2019-06-24 ENCOUNTER — Ambulatory Visit: Payer: Medicare Other | Admitting: Orthopedic Surgery

## 2019-06-26 ENCOUNTER — Encounter: Payer: Self-pay | Admitting: Physical Therapy

## 2019-06-26 ENCOUNTER — Other Ambulatory Visit: Payer: Self-pay

## 2019-06-26 ENCOUNTER — Ambulatory Visit (INDEPENDENT_AMBULATORY_CARE_PROVIDER_SITE_OTHER): Payer: Medicare Other | Admitting: Physical Therapy

## 2019-06-26 DIAGNOSIS — R29898 Other symptoms and signs involving the musculoskeletal system: Secondary | ICD-10-CM | POA: Diagnosis not present

## 2019-06-26 DIAGNOSIS — G8929 Other chronic pain: Secondary | ICD-10-CM

## 2019-06-26 DIAGNOSIS — M5441 Lumbago with sciatica, right side: Secondary | ICD-10-CM

## 2019-06-26 DIAGNOSIS — M5442 Lumbago with sciatica, left side: Secondary | ICD-10-CM

## 2019-06-26 NOTE — Therapy (Signed)
[facility] [address] [place], [place], [address] Phone: [phone]   Fax:  [phone]  Physical Therapy Treatment  Patient Details  Name: Travis Hendricks MRN: [id number] Date of Birth: 03/21/1941 Referring Provider (PT): Duda, Marcus V, MD   Encounter Date: 06/26/2019  PT End of Session - 06/26/19 1512    Visit Number  2    Number of Visits  6    Date for PT Re-Evaluation  07/16/19    PT Start Time  1400    PT Stop Time  1445    PT Time Calculation (min)  45 min    Activity Tolerance  Patient tolerated treatment well    Behavior During Therapy  WFL for tasks assessed/performed       Past Medical History:  Diagnosis Date  . Basal cell carcinoma   . Diverticulosis   . Other and unspecified hyperlipidemia     Past Surgical History:  Procedure Laterality Date  . CATARACT EXTRACTION W/ INTRAOCULAR LENS IMPLANT     bilaterally  . COLONOSCOPY     Tics  . ELBOW SURGERY     torn ligament  . HERNIA REPAIR    . KNEE ARTHROSCOPY    . VASECTOMY      There were no vitals filed for this visit.  Subjective Assessment - 06/26/19 1401    Subjective  doing well; having some pain into Rt hip and proximal thigh - piercing type pain    Limitations  Sitting    How long can you sit comfortably?  1 hour    Patient Stated Goals  to get MRI - needs to be seen by PT for ~ 4 times    Currently in Pain?  No/denies    Pain Onset  More than a month ago                       [facility] Adult PT Treatment/Exercise - 06/26/19 1403      Lumbar Exercises: Stretches   Passive Hamstring Stretch  Right;Left;2 reps;30 seconds    Passive Hamstring Stretch Limitations  supine with strap    Piriformis Stretch  Right;Left;2 reps;30 seconds      Lumbar Exercises: Aerobic   Nustep  L7 x 5 min      Manual Therapy   Manual Therapy  Joint mobilization;Soft tissue mobilization    Manual therapy comments  skilled palpation and monitoring of soft  tissue during DN    Joint Mobilization  Rt hip LAD and A/P mobs grades 2-3    Soft tissue mobilization  Rt TFL with trigger point release       Trigger Point Dry Needling - 06/26/19 1506    Consent Given?  Yes    Education Handout Provided  No   verbally reviewed; will provide next session   Muscles Treated Back/Hip  Tensor fascia lata    Tensor Fascia Lata Response  Twitch response elicited;Palpable increased muscle length                PT Long Term Goals - 06/04/19 1310      PT LONG TERM GOAL #1   Title  independent with HEP    Status  New    Target Date  07/16/19      PT LONG TERM GOAL #2   Title  report pain < 3/10 for improved function    Status  New    Target Date  07/16/19        PT LONG TERM GOAL #3   Title  report ability to sit > 2 hours for improved driving tolerance and decreased pain    Status  New    Target Date  07/16/19            Plan - 06/26/19 1512    Clinical Impression Statement  Pt reported improvement in radicular symptoms today but with a new onset of Rt hip pain.  Able to reproduce pain with palpation to Rt TFL today so trial of DN and manual therapy performed to see if pt has benefit.  Pt will continue to benefit from PT to decrease pain and maximize function.  No goals met as only 2nd visit.    Examination-Activity Limitations  Sit    Examination-Participation Restrictions  Driving    Stability/Clinical Decision Making  Stable/Uncomplicated    Rehab Potential  Good    PT Frequency  1x / week    PT Duration  6 weeks    PT Treatment/Interventions  ADLs/Self Care Home Management;Cryotherapy;Electrical Stimulation;Ultrasound;Traction;Moist Heat;Gait training;Stair training;Functional mobility training;Therapeutic activities;Therapeutic exercise;Patient/family education;Manual techniques;Taping;Dry needling    PT Next Visit Plan  review remaining HEP, add core exercises, additional stretches; see how he responded to DN    PT Home Exercise  Plan  Access Code: [email or web address]    Consulted and Agree with Plan of Care  Patient       Patient will benefit from skilled therapeutic intervention in order to improve the following deficits and impairments:  Pain, Postural dysfunction, Hypomobility, Decreased range of motion, Impaired flexibility  Visit Diagnosis: Chronic bilateral low back pain with bilateral sciatica  Other symptoms and signs involving the musculoskeletal system     Problem List Patient Active Problem List   Diagnosis Date Noted  . Basal cell carcinoma 11/25/2015  . 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 DIVERTICULOSIS, COLON 06/09/2008  . LOW BACK PAIN, CHRONIC 06/09/2008  . HYPERLIPIDEMIA 07/21/2006  . [facility] SYNDROME 07/21/2006      Laureen Abrahams, PT, DPT 06/26/19 3:16 PM     [facility] [address] [place], [place], [address] Phone: [phone]   Fax:  [phone]  Name: Travis Hendricks MRN: [id number] Date of Birth: 1941/01/29

## 2019-07-02 ENCOUNTER — Encounter: Payer: Medicare Other | Admitting: Physical Therapy

## 2019-07-03 ENCOUNTER — Other Ambulatory Visit: Payer: Self-pay

## 2019-07-03 ENCOUNTER — Encounter: Payer: Self-pay | Admitting: Physical Therapy

## 2019-07-03 ENCOUNTER — Ambulatory Visit (INDEPENDENT_AMBULATORY_CARE_PROVIDER_SITE_OTHER): Payer: Medicare Other | Admitting: Physical Therapy

## 2019-07-03 DIAGNOSIS — M5441 Lumbago with sciatica, right side: Secondary | ICD-10-CM

## 2019-07-03 DIAGNOSIS — R29898 Other symptoms and signs involving the musculoskeletal system: Secondary | ICD-10-CM | POA: Diagnosis not present

## 2019-07-03 DIAGNOSIS — G8929 Other chronic pain: Secondary | ICD-10-CM | POA: Diagnosis not present

## 2019-07-03 DIAGNOSIS — M5442 Lumbago with sciatica, left side: Secondary | ICD-10-CM | POA: Diagnosis not present

## 2019-07-03 NOTE — Therapy (Signed)
[facility] Physical Therapy [address] [place], [place], [address] Phone: [phone]   Fax:  [phone]  Physical Therapy Treatment  Patient Details  Name: Travis Hendricks MRN: [id number] Date of Birth: [birth year] Referring Provider (PT): Newt Minion, MD   Encounter Date: 07/03/2019  PT End of Session - 07/03/19 1609    Visit Number  3    Number of Visits  6    Date for PT Re-Evaluation  07/16/19    PT Start Time  4917    PT Stop Time  1607    PT Time Calculation (min)  38 min    Activity Tolerance  Patient tolerated treatment well    Behavior During Therapy  [facility] for tasks assessed/performed       Past Medical History:  Diagnosis Date  . Basal cell carcinoma   . Diverticulosis   . Other and unspecified hyperlipidemia     Past Surgical History:  Procedure Laterality Date  . CATARACT EXTRACTION W/ INTRAOCULAR LENS IMPLANT     bilaterally  . COLONOSCOPY     Tics  . ELBOW SURGERY     torn ligament  . HERNIA REPAIR    . KNEE ARTHROSCOPY    . VASECTOMY      There were no vitals filed for this visit.  Subjective Assessment - 07/03/19 1534    Subjective  feeling better; not having catching episode as much in hip/thigh    Limitations  Sitting    How long can you sit comfortably?  1.25 hours (states this is as long as he will typically sit)    Patient Stated Goals  to get MRI - needs to be seen by PT for ~ 4 times    Currently in Pain?  No/denies    Pain Onset  More than a month ago         [facility] PT Assessment - 07/03/19 1607      Assessment   Medical Diagnosis  Right-sided radicular pain to the lateral right thigh    Referring Provider (PT)  Newt Minion, MD    Next MD Visit  07/08/2019      Palpation   Palpation comment  pt with trigger points noted in glute med and likely in piriformis - declined DN today and no reproduction of familiar pain with palpation.                   [place] Adult PT Treatment/Exercise -  07/03/19 1534      Lumbar Exercises: Stretches   Passive Hamstring Stretch  Right;Left;2 reps;30 seconds    Passive Hamstring Stretch Limitations  supine with strap    Quad Stretch  Right;2 reps;30 seconds    Quad Stretch Limitations  supine with strap    ITB Stretch  Right;Left;2 reps;30 seconds    ITB Stretch Limitations  supine with strap    Piriformis Stretch  Right;Left;2 reps;30 seconds      Lumbar Exercises: Aerobic   Tread Mill  2.5-3.0 mph x 5 min                  PT Long Term Goals - 07/03/19 1609      PT LONG TERM GOAL #1   Title  independent with HEP    Status  On-going      PT LONG TERM GOAL #2   Title  report pain < 3/10 for improved function    Status  Achieved      PT  LONG TERM GOAL #3   Title  report ability to sit > 2 hours for improved driving tolerance and decreased pain    Baseline  1/13: reports 1.25 hours which is typically max time prior to pain    Status  Partially Met            Plan - 07/03/19 1609    Clinical Impression Statement  Pt is overall doing well reporting resolve of pain in back and hip.  He still has some trigger points and may benefit from additional DN, but at this time decided against as he's not having any pain.  To see MD next week, and will determine next steps.    Examination-Activity Limitations  Sit    Examination-Participation Restrictions  Driving    Stability/Clinical Decision Making  Stable/Uncomplicated    Rehab Potential  Good    PT Frequency  1x / week    PT Duration  6 weeks    PT Treatment/Interventions  ADLs/Self Care Home Management;Cryotherapy;Electrical Stimulation;Ultrasound;Traction;Moist Heat;Gait training;Stair training;Functional mobility training;Therapeutic activities;Therapeutic exercise;Patient/family education;Manual techniques;Taping;Dry needling    PT Next Visit Plan  see what MD says, progress core/flexibility    PT Home Exercise Plan  Access Code: [email or web address]    Consulted and Agree with  Plan of Care  Patient       Patient will benefit from skilled therapeutic intervention in order to improve the following deficits and impairments:  Pain, Postural dysfunction, Hypomobility, Decreased range of motion, Impaired flexibility  Visit Diagnosis: Chronic bilateral low back pain with bilateral sciatica  Other symptoms and signs involving the musculoskeletal system     Problem List Patient Active Problem List   Diagnosis Date Noted  . Basal cell carcinoma 11/25/2015  . 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 DIVERTICULOSIS, COLON 06/09/2008  . LOW BACK PAIN, CHRONIC 06/09/2008  . HYPERLIPIDEMIA 07/21/2006  . [facility] SYNDROME 07/21/2006      Laureen Abrahams, PT, DPT 07/03/19 4:11 PM    [facility] [address] [place], [place], [address] Phone: [phone]   Fax:  [phone]  Name: OTHON GUARDIA MRN: [id number] Date of Birth: 02/08/1941

## 2019-07-08 ENCOUNTER — Ambulatory Visit (INDEPENDENT_AMBULATORY_CARE_PROVIDER_SITE_OTHER): Payer: Medicare Other | Admitting: Orthopedic Surgery

## 2019-07-08 ENCOUNTER — Other Ambulatory Visit: Payer: Self-pay

## 2019-07-08 ENCOUNTER — Encounter: Payer: Self-pay | Admitting: Orthopedic Surgery

## 2019-07-08 VITALS — Ht 68.0 in | Wt 177.0 lb

## 2019-07-08 DIAGNOSIS — G8929 Other chronic pain: Secondary | ICD-10-CM

## 2019-07-08 DIAGNOSIS — M5441 Lumbago with sciatica, right side: Secondary | ICD-10-CM | POA: Diagnosis not present

## 2019-07-08 NOTE — Progress Notes (Signed)
Office Visit Note   Patient: Travis Hendricks           Date of Birth: 11/26/1940           MRN: [id number] Visit Date: 07/08/2019              Requested by: [name], MD [address] [place],   [address] PCP: [name], MD  Chief Complaint  Patient presents with  . Lower Back - Follow-up  . Right Foot - Follow-up      HPI: Patient is a 79 year old gentleman who is seen in follow-up status post nail excision for paronychial infection and status post right sided radicular symptoms.  Patient is currently completing a course of physical therapy.  Patient states the pain has reoccurred and has radiating pain down the lateral aspect of the right calf does not go to his toes.  Assessment & Plan: Visit Diagnoses: No diagnosis found.  Plan: Due to failure of conservative treatment including physical therapy we will set him up for an MRI scan to evaluate the lumbar spine.  Anticipate following up with Dr. Ernestina Patches after the MRI scan to evaluate for epidural steroid injection.  Follow-Up Instructions: Return in about 1 week (around 07/15/2019) for Follow-up with Dr. Laurence Spates after the MRI scan.Manson Passey Exam  Patient is alert, oriented, no adenopathy, well-dressed, normal affect, normal respiratory effort. Examination patient's nail is healed well there is no cellulitis.  Examination of the lumbar spine he has a positive straight leg raise on the right he has good motor strength in all motor groups of the right lower extremity no focal motor weakness.  Imaging: No results found. No images are attached to the encounter.  Labs: Lab Results  Component Value Date   HGBA1C 5.7 06/28/2011   HGBA1C 5.9 06/24/2008     Lab Results  Component Value Date   ALBUMIN 3.8 06/28/2011   ALBUMIN 3.6 06/18/2010   ALBUMIN 3.7 06/01/2009    No results found for: MG No results found for: VD25OH  No results found for: PREALBUMIN CBC EXTENDED Latest Ref Rng & Units  06/28/2011 06/18/2010 06/01/2009  WBC 4.5 - 10.5 K/uL 4.6 5.8 5.6  RBC 4.22 - 5.81 Mil/uL 4.65 4.82 4.79  HGB 13.0 - 17.0 g/dL 15.1 15.4 15.4  HCT 39.0 - 52.0 % 44.4 45.6 45.6  PLT 150.0 - 400.0 K/uL 235.0 236.0 239.0  NEUTROABS 1.4 - 7.7 K/uL 2.0 2.4 2.5  LYMPHSABS 0.7 - 4.0 K/uL 1.9 2.5 2.1     Body mass index is 26.91 kg/m.  Orders:  No orders of the defined types were placed in this encounter.  No orders of the defined types were placed in this encounter.    Procedures: No procedures performed  Clinical Data: No additional findings.  ROS:  All other systems negative, except as noted in the HPI. Review of Systems  Objective: Vital Signs: Ht 5\' 8"  (1.727 m)   Wt 177 lb (80.3 kg)   BMI 26.91 kg/m   Specialty Comments:  No specialty comments available.  PMFS History: Patient Active Problem List   Diagnosis Date Noted  . Basal cell carcinoma 11/25/2015  . 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 DIVERTICULOSIS, COLON 06/09/2008  . LOW BACK PAIN, CHRONIC 06/09/2008  . HYPERLIPIDEMIA 07/21/2006  . [facility] SYNDROME 07/21/2006   Past Medical History:  Diagnosis Date  . Basal cell carcinoma   . Diverticulosis   . Other and unspecified hyperlipidemia  Family History  Problem Relation Age of Onset  . Heart attack Father   . Cancer Mother        lung  . Cancer Brother        cancer    Past Surgical History:  Procedure Laterality Date  . CATARACT EXTRACTION W/ INTRAOCULAR LENS IMPLANT     bilaterally  . COLONOSCOPY     Tics  . ELBOW SURGERY     torn ligament  . HERNIA REPAIR    . KNEE ARTHROSCOPY    . VASECTOMY     Social History   Occupational History  . Not on file  Tobacco Use  . Smoking status: Never Smoker  . Smokeless tobacco: Never Used  Substance and Sexual Activity  . Alcohol use: Yes    Alcohol/week: 14.0 standard drinks    Types: 7 Shots of liquor, 7 Glasses of wine per week  . Drug use: No  . Sexual activity: Not on file

## 2019-07-10 ENCOUNTER — Encounter: Payer: Self-pay | Admitting: Physical Therapy

## 2019-07-13 ENCOUNTER — Ambulatory Visit: Payer: Medicare Other | Attending: Internal Medicine

## 2019-07-13 DIAGNOSIS — Z23 Encounter for immunization: Secondary | ICD-10-CM | POA: Insufficient documentation

## 2019-07-24 DIAGNOSIS — L409 Psoriasis, unspecified: Secondary | ICD-10-CM | POA: Diagnosis not present

## 2019-07-24 DIAGNOSIS — M353 Polymyalgia rheumatica: Secondary | ICD-10-CM | POA: Diagnosis not present

## 2019-07-24 DIAGNOSIS — M25559 Pain in unspecified hip: Secondary | ICD-10-CM | POA: Diagnosis not present

## 2019-07-24 DIAGNOSIS — Z8546 Personal history of malignant neoplasm of prostate: Secondary | ICD-10-CM | POA: Diagnosis not present

## 2019-07-24 DIAGNOSIS — Z79899 Other long term (current) drug therapy: Secondary | ICD-10-CM | POA: Diagnosis not present

## 2019-07-24 DIAGNOSIS — M1990 Unspecified osteoarthritis, unspecified site: Secondary | ICD-10-CM | POA: Diagnosis not present

## 2019-07-24 DIAGNOSIS — Z8739 Personal history of other diseases of the musculoskeletal system and connective tissue: Secondary | ICD-10-CM | POA: Diagnosis not present

## 2019-07-25 ENCOUNTER — Ambulatory Visit: Payer: Medicare Other | Attending: Internal Medicine

## 2019-07-25 DIAGNOSIS — Z23 Encounter for immunization: Secondary | ICD-10-CM | POA: Insufficient documentation

## 2019-07-25 NOTE — Progress Notes (Signed)
   Covid-19 Vaccination Clinic  Name:  Travis Hendricks    MRN: [id number] DOB: 12/28/1940  07/25/2019  Mr. Travis Hendricks was observed post Covid-19 immunization for 15 minutes without incidence. He was provided with Vaccine Information Sheet and instruction to access the V-Safe system.   Mr. Travis Hendricks was instructed to call 911 with any severe reactions post vaccine: Marland Kitchen Difficulty breathing  . Swelling of your face and throat  . A fast heartbeat  . A bad rash all over your body  . Dizziness and weakness    Immunizations Administered    Name Date Dose VIS Date Route   Pfizer COVID-19 Vaccine 07/25/2019 12:08 PM 0.3 mL 05/31/2019 Intramuscular   Manufacturer: [place]   Lot: [id number]   [place]: [id number]

## 2019-07-26 ENCOUNTER — Ambulatory Visit (INDEPENDENT_AMBULATORY_CARE_PROVIDER_SITE_OTHER): Payer: Medicare Other | Admitting: Physical Therapy

## 2019-07-26 ENCOUNTER — Encounter: Payer: Self-pay | Admitting: Physical Therapy

## 2019-07-26 ENCOUNTER — Other Ambulatory Visit: Payer: Self-pay

## 2019-07-26 DIAGNOSIS — M5441 Lumbago with sciatica, right side: Secondary | ICD-10-CM

## 2019-07-26 DIAGNOSIS — G8929 Other chronic pain: Secondary | ICD-10-CM | POA: Diagnosis not present

## 2019-07-26 DIAGNOSIS — R29898 Other symptoms and signs involving the musculoskeletal system: Secondary | ICD-10-CM

## 2019-07-26 DIAGNOSIS — M5442 Lumbago with sciatica, left side: Secondary | ICD-10-CM

## 2019-07-26 NOTE — Therapy (Signed)
[facility] Physical Therapy [address] [place], [place], [address] Phone: [phone]   Fax:  [phone]  Physical Therapy Treatment/Discharge Summary  Patient Details  Name: Travis Hendricks MRN: [id number] Date of Birth: 25-Sep-1940 Referring Provider (PT): Newt Minion, MD   Encounter Date: 07/26/2019  PT End of Session - 07/26/19 0956    Visit Number  4    Number of Visits  6    PT Start Time  0931    PT Stop Time  0947    PT Time Calculation (min)  16 min    Activity Tolerance  Patient tolerated treatment well    Behavior During Therapy  [facility] for tasks assessed/performed       Past Medical History:  Diagnosis Date  . Basal cell carcinoma   . Diverticulosis   . Other and unspecified hyperlipidemia     Past Surgical History:  Procedure Laterality Date  . CATARACT EXTRACTION W/ INTRAOCULAR LENS IMPLANT     bilaterally  . COLONOSCOPY     Tics  . ELBOW SURGERY     torn ligament  . HERNIA REPAIR    . KNEE ARTHROSCOPY    . VASECTOMY      There were no vitals filed for this visit.  Subjective Assessment - 07/26/19 0934    Subjective  has MRI scheduled for 2/18.  was doing well until after last session.  had increase in pain "worse than I've ever been" but then pain has since subsided.    Limitations  Sitting    How long can you sit comfortably?  1.25 hours (states this is as long as he will typically sit)    Patient Stated Goals  to get MRI - needs to be seen by PT for ~ 4 times    Currently in Pain?  No/denies    Pain Onset  More than a month ago                       [facility] Adult PT Treatment/Exercise - 07/26/19 0954      Self-Care   Self-Care  Other Self-Care Comments    Other Self-Care Comments   pt now scheduled for MRI - reviewed goals and HEP with pt as well as exercises from trainer.  advised against full sit up, and recommended he continue with stretches to help with flexibility.  pt verbalized understanding              PT Education - 07/26/19 0956    Education Details  see self care    Person(s) Educated  Patient    Methods  Explanation;Demonstration;Handout    Comprehension  Verbalized understanding;Returned demonstration;Need further instruction          PT Long Term Goals - 07/26/19 0957      PT LONG TERM GOAL #1   Title  independent with HEP    Status  Achieved      PT LONG TERM GOAL #2   Title  report pain < 3/10 for improved function    Status  Achieved      PT LONG TERM GOAL #3   Title  report ability to sit > 2 hours for improved driving tolerance and decreased pain    Baseline  1/13: reports 1.25 hours which is typically max time prior to pain    Status  Partially Met            Plan - 07/26/19 0957    Clinical  Impression Statement  Pt has met/partially met all LTGs and at this time is requesting d/c as he's scheduled for MRI.  Will d/c PT today.    Examination-Activity Limitations  Sit    Examination-Participation Restrictions  Driving    Stability/Clinical Decision Making  Stable/Uncomplicated    Rehab Potential  Good    PT Frequency  1x / week    PT Duration  6 weeks    PT Treatment/Interventions  ADLs/Self Care Home Management;Cryotherapy;Electrical Stimulation;Ultrasound;Traction;Moist Heat;Gait training;Stair training;Functional mobility training;Therapeutic activities;Therapeutic exercise;Patient/family education;Manual techniques;Taping;Dry needling    PT Next Visit Plan  d/c PT    PT Home Exercise Plan  Access Code: [email or web address]    Consulted and Agree with Plan of Care  Patient       Patient will benefit from skilled therapeutic intervention in order to improve the following deficits and impairments:  Pain, Postural dysfunction, Hypomobility, Decreased range of motion, Impaired flexibility  Visit Diagnosis: Chronic bilateral low back pain with bilateral sciatica  Other symptoms and signs involving the musculoskeletal system     Problem  List Patient Active Problem List   Diagnosis Date Noted  . Basal cell carcinoma 11/25/2015  . 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 DIVERTICULOSIS, COLON 06/09/2008  . LOW BACK PAIN, CHRONIC 06/09/2008  . HYPERLIPIDEMIA 07/21/2006  . [facility] SYNDROME 07/21/2006      Laureen Abrahams, PT, DPT 07/26/19 10:00 AM     [facility] Physical Therapy [address] [place], [place], [address] Phone: [phone]   Fax:  [phone]  Name: Travis Hendricks MRN: [id number] Date of Birth: 18-Jun-1941      PHYSICAL THERAPY DISCHARGE SUMMARY  Visits from Start of Care: 4  Current functional level related to goals / functional outcomes: See above   Remaining deficits: See above   Education / Equipment: HEP  Plan: Patient agrees to discharge.  Patient goals were met. Patient is being discharged due to meeting the stated rehab goals.  ?????     Pt also scheduled for MRI.  Laureen Abrahams, PT, DPT 07/26/19 10:01 AM  [facility] Physical Therapy [address] [place], [place], [address] Phone: [phone]   Fax:  [phone]

## 2019-08-06 ENCOUNTER — Ambulatory Visit: Payer: Medicare Other

## 2019-08-07 ENCOUNTER — Ambulatory Visit
Admission: RE | Admit: 2019-08-07 | Discharge: 2019-08-07 | Disposition: A | Discharge status: Home / Self Care | Payer: Medicare Other | Source: Ambulatory Visit | Attending: Orthopedic Surgery | Admitting: Orthopedic Surgery

## 2019-08-07 ENCOUNTER — Other Ambulatory Visit: Payer: Self-pay

## 2019-08-07 DIAGNOSIS — M48061 Spinal stenosis, lumbar region without neurogenic claudication: Secondary | ICD-10-CM | POA: Diagnosis not present

## 2019-08-07 DIAGNOSIS — G8929 Other chronic pain: Secondary | ICD-10-CM

## 2019-08-08 ENCOUNTER — Other Ambulatory Visit: Payer: Medicare Other

## 2019-08-12 ENCOUNTER — Other Ambulatory Visit: Payer: Self-pay

## 2019-08-12 DIAGNOSIS — G8929 Other chronic pain: Secondary | ICD-10-CM

## 2019-08-19 ENCOUNTER — Ambulatory Visit: Payer: Medicare Other | Attending: Internal Medicine

## 2019-08-19 DIAGNOSIS — Z23 Encounter for immunization: Secondary | ICD-10-CM | POA: Insufficient documentation

## 2019-08-19 NOTE — Progress Notes (Signed)
   Covid-19 Vaccination Clinic  Name:  Travis Hendricks    MRN: [id number] DOB: July 13, 1940  08/19/2019  [name] was observed post Covid-19 immunization for 15 minutes without incidence. He was provided with Vaccine Information Sheet and instruction to access the V-Safe system.   [name] was instructed to call 911 with any severe reactions post vaccine: Marland Kitchen Difficulty breathing  . Swelling of your face and throat  . A fast heartbeat  . A bad rash all over your body  . Dizziness and weakness    Immunizations Administered    Name Date Dose VIS Date Route   Pfizer COVID-19 Vaccine 08/19/2019  3:20 PM 0.3 mL 05/31/2019 Intramuscular   Manufacturer: [place]   Lot: [id number]   [place]: [id number]

## 2019-09-02 ENCOUNTER — Other Ambulatory Visit: Payer: Self-pay

## 2019-09-02 ENCOUNTER — Ambulatory Visit (INDEPENDENT_AMBULATORY_CARE_PROVIDER_SITE_OTHER): Payer: Medicare Other | Admitting: Physical Medicine and Rehabilitation

## 2019-09-02 ENCOUNTER — Ambulatory Visit: Payer: Self-pay

## 2019-09-02 ENCOUNTER — Encounter: Payer: Self-pay | Admitting: Physical Medicine and Rehabilitation

## 2019-09-02 VITALS — BP 144/75 | HR 44

## 2019-09-02 DIAGNOSIS — M48061 Spinal stenosis, lumbar region without neurogenic claudication: Secondary | ICD-10-CM | POA: Diagnosis not present

## 2019-09-02 DIAGNOSIS — M5416 Radiculopathy, lumbar region: Secondary | ICD-10-CM

## 2019-09-02 MED ORDER — METHYLPREDNISOLONE ACETATE 80 MG/ML IJ SUSP
40.0000 mg | Freq: Once | INTRAMUSCULAR | Status: AC
  Administered 2019-09-02: 40 mg

## 2019-09-02 NOTE — Procedures (Signed)
Lumbosacral Transforaminal Epidural Steroid Injection - Sub-Pedicular Approach with Fluoroscopic Guidance  Patient: Travis Hendricks      Date of Birth: 09-27-1940 MRN: [id number] PCP: [name], MD      Visit Date: 09/02/2019   Universal Protocol:    Date/Time: 09/02/2019  Consent Given By: the patient  Position: PRONE  Additional Comments: Vital signs were monitored before and after the procedure. Patient was prepped and draped in the usual sterile fashion. The correct patient, procedure, and site was verified.   Injection Procedure Details:  Procedure Site One Meds Administered:  Meds ordered this encounter  Medications  . methylPREDNISolone acetate (DEPO-MEDROL) injection 40 mg    Laterality: Right  Location/Site:  L5-S1  Needle size: 22 G  Needle type: Spinal  Needle Placement: Transforaminal  Findings:    -Comments: Excellent flow of contrast along the nerve and into the epidural space.  Patient does have high iliac crest which required more cranial tilt of the C arm showing more oblique angle.  Procedure Details: After squaring off the end-plates to get a true AP view, the C-arm was positioned so that an oblique view of the foramen as noted above was visualized. The target area is just inferior to the "nose of the scotty dog" or sub pedicular. The soft tissues overlying this structure were infiltrated with 2-3 ml. of 1% Lidocaine without Epinephrine.  The spinal needle was inserted toward the target using a "trajectory" view along the fluoroscope beam.  Under AP and lateral visualization, the needle was advanced so it did not puncture dura and was located close the 6 O'Clock position of the pedical in AP tracterory. Biplanar projections were used to confirm position. Aspiration was confirmed to be negative for CSF and/or blood. A 1-2 ml. volume of Isovue-250 was injected and flow of contrast was noted at each level. Radiographs were obtained for  documentation purposes.   After attaining the desired flow of contrast documented above, a 0.5 to 1.0 ml test dose of 0.25% Marcaine was injected into each respective transforaminal space.  The patient was observed for 90 seconds post injection.  After no sensory deficits were reported, and normal lower extremity motor function was noted,   the above injectate was administered so that equal amounts of the injectate were placed at each foramen (level) into the transforaminal epidural space.   Additional Comments:  The patient tolerated the procedure well Dressing: 2 x 2 sterile gauze and Band-Aid    Post-procedure details: Patient was observed during the procedure. Post-procedure instructions were reviewed.  Patient left the clinic in stable condition.

## 2019-09-02 NOTE — Progress Notes (Signed)
Travis Travis - 79 y.o. male MRN [id number]  Date of birth: [birth year]  Office Visit Note: Visit Date: 09/02/2019 PCP: Travis Limes, MD Referred by: Travis Limes, MD  Subjective: Chief Complaint  Patient presents with  . Lower Back - Pain  . Right Thigh - Pain   HPI:  Travis Travis is a 79 y.o. male who comes in today Patient is an acquaintance of Dr. Sharol Given and does ski with him frequently.  He is complaining of right hip and thigh pain.  He has had prior lumbar discectomy laminectomy many years ago.  He has had new MRI which was reviewed below.  He has a mild bit of narrowing at L2-3 he also has degenerative disc height loss which by itself is not necessarily a big deal but at L5-S1 there is a central protrusion but there is also some right lateral recess narrowing and I think that may be the issue in terms of his pain.  He also has lateral recess narrowing at L3-4 which could cause an L4 issue.  No high-grade compression or stenosis.  I did elect to complete diagnostic L5 transforaminal injection today.  Would consider L4 injection.  The patient has failed conservative care including home exercise, medications, time and activity modification.  This injection will be diagnostic and hopefully therapeutic.  Please see requesting physician notes for further details and justification.   ROS Otherwise per HPI.  Assessment & Plan: Visit Diagnoses:  1. Lumbar radiculopathy   2. Stenosis of lateral recess of lumbar spine     Plan: No additional findings.   Meds & Orders:  Meds ordered this encounter  Medications  . methylPREDNISolone acetate (DEPO-MEDROL) injection 40 mg    Orders Placed This Encounter  Procedures  . XR C-ARM NO REPORT  . Epidural Steroid injection    Follow-up: Return if symptoms worsen or fail to improve.   Procedures: No procedures performed  Lumbosacral Transforaminal Epidural Steroid Injection - Sub-Pedicular Approach with Fluoroscopic  Guidance  Patient: Travis Travis      Date of Birth: [birth year] MRN: [id number] PCP: Travis Limes, MD      Visit Date: 09/02/2019   Universal Protocol:    Date/Time: 09/02/2019  Consent Given By: the patient  Position: PRONE  Additional Comments: Vital signs were monitored before and after the procedure. Patient was prepped and draped in the usual sterile fashion. The correct patient, procedure, and site was verified.   Injection Procedure Details:  Procedure Site One Meds Administered:  Meds ordered this encounter  Medications  . methylPREDNISolone acetate (DEPO-MEDROL) injection 40 mg    Laterality: Right  Location/Site:  L5-S1  Needle size: 22 G  Needle type: Spinal  Needle Placement: Transforaminal  Findings:    -Comments: Excellent flow of contrast along the nerve and into the epidural space.  Patient does have high iliac crest which required more cranial tilt of the C arm showing more oblique angle.  Procedure Details: After squaring off the end-plates to get a true AP view, the C-arm was positioned so that an oblique view of the foramen as noted above was visualized. The target area is just inferior to the "nose of the scotty dog" or sub pedicular. The soft tissues overlying this structure were infiltrated with 2-3 ml. of 1% Lidocaine without Epinephrine.  The spinal needle was inserted toward the target using a "trajectory" view along the fluoroscope beam.  Under AP and lateral visualization, the needle was advanced so  it did not puncture dura and was located close the 6 O'Clock position of the pedical in AP tracterory. Biplanar projections were used to confirm position. Aspiration was confirmed to be negative for CSF and/or blood. A 1-2 ml. volume of Isovue-250 was injected and flow of contrast was noted at each level. Radiographs were obtained for documentation purposes.   After attaining the desired flow of contrast documented above, a 0.5 to 1.0 ml  test dose of 0.25% Marcaine was injected into each respective transforaminal space.  The patient was observed for 90 seconds post injection.  After no sensory deficits were reported, and normal lower extremity motor function was noted,   the above injectate was administered so that equal amounts of the injectate were placed at each foramen (level) into the transforaminal epidural space.   Additional Comments:  The patient tolerated the procedure well Dressing: 2 x 2 sterile gauze and Band-Aid    Post-procedure details: Patient was observed during the procedure. Post-procedure instructions were reviewed.  Patient left the clinic in stable condition.     Clinical History: MRI LUMBAR SPINE WITHOUT CONTRAST  TECHNIQUE: Multiplanar, multisequence MR imaging of the lumbar spine was performed. No intravenous contrast was administered.  COMPARISON:  01/09/2009  FINDINGS: Segmentation:  Standard lumbar numbering  Alignment:  Normal  Vertebrae: No evidence of fracture, discitis, or aggressive bone lesion  Conus medullaris and cauda equina: Conus extends to the L1 level. Conus and cauda equina appear normal.  Paraspinal and other soft tissues: Negative  Disc levels:  T12- L1: Unremarkable.  L1-L2: Disc bulging with posterior annular fissure. Negative facets. No impingement  L2-L3: Disc narrowing and bulging and posterior element hypertrophy. Bilateral subarticular recess stenosis, greater on the right where there is a 6 mm flat synovial cyst. The foramina are patent  L3-L4: Posterior element hypertrophy. Disc narrowing and bulging. Bilateral subarticular recess narrowing without static L4 compression. 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 There is endplate and facet spurring. Prior laminectomy with patent spinal canal. Patent foramina  L5-S1:Disc narrowing and ridging. Minor facet  spurring. No impingement.  IMPRESSION: 1. 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 2. 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 3. L3-4 mild spinal and bilateral subarticular recess stenosis. 4. L4-5 widely patent canal after laminectomy. 5. Diffusely patent foramina.   Electronically Signed   By: Monte Fantasia M.D.   On: 08/08/2019 04:17     Objective:  VS:  HT:    WT:   BMI:     BP:(!) 144/75  HR:(!) 44bpm  TEMP: ( )  RESP:  Physical Exam  Ortho Exam Imaging: XR C-ARM NO REPORT  Result Date: 09/02/2019 Please see Notes tab for imaging impression.

## 2019-09-02 NOTE — Progress Notes (Signed)
 .  Numeric Pain Rating Scale and Functional Assessment Average Pain 5   In the last MONTH (on 0-10 scale) has pain interfered with the following?  1. General activity like being  able to carry out your everyday physical activities such as walking, climbing stairs, carrying groceries, or moving a chair?  Rating(6)   +Driver, -BT, -Dye Allergies.  

## 2019-09-19 DIAGNOSIS — Z961 Presence of intraocular lens: Secondary | ICD-10-CM | POA: Diagnosis not present

## 2019-09-19 DIAGNOSIS — H524 Presbyopia: Secondary | ICD-10-CM | POA: Diagnosis not present

## 2019-09-19 DIAGNOSIS — H35372 Puckering of macula, left eye: Secondary | ICD-10-CM | POA: Diagnosis not present

## 2019-11-05 DIAGNOSIS — Z79899 Other long term (current) drug therapy: Secondary | ICD-10-CM | POA: Diagnosis not present

## 2019-11-05 DIAGNOSIS — M5136 Other intervertebral disc degeneration, lumbar region: Secondary | ICD-10-CM | POA: Diagnosis not present

## 2019-11-05 DIAGNOSIS — Z8546 Personal history of malignant neoplasm of prostate: Secondary | ICD-10-CM | POA: Diagnosis not present

## 2019-11-05 DIAGNOSIS — M1990 Unspecified osteoarthritis, unspecified site: Secondary | ICD-10-CM | POA: Diagnosis not present

## 2019-11-05 DIAGNOSIS — M353 Polymyalgia rheumatica: Secondary | ICD-10-CM | POA: Diagnosis not present

## 2019-11-05 DIAGNOSIS — L409 Psoriasis, unspecified: Secondary | ICD-10-CM | POA: Diagnosis not present

## 2019-11-05 DIAGNOSIS — M25559 Pain in unspecified hip: Secondary | ICD-10-CM | POA: Diagnosis not present

## 2019-11-05 DIAGNOSIS — Z8739 Personal history of other diseases of the musculoskeletal system and connective tissue: Secondary | ICD-10-CM | POA: Diagnosis not present

## 2019-11-13 ENCOUNTER — Other Ambulatory Visit: Payer: Self-pay

## 2019-11-13 ENCOUNTER — Telehealth: Payer: Self-pay | Admitting: Orthopedic Surgery

## 2019-11-13 DIAGNOSIS — G8929 Other chronic pain: Secondary | ICD-10-CM

## 2019-11-13 NOTE — Telephone Encounter (Signed)
I called pt and tried to understand what it was that he was wanting. The message below is unclear. The pt states multiple times that he would not tell me what he needs that I can just ask Dr. Sharol Given because he will " know what I need" again I asked the pt if I could help him with anything today and that Dr. Sharol Given is in the OR all day and the pt states that he is in no rush and that he wont tell me what he needs that "Dr. Sharol Given can just do it" will hold message and discuss tomorrow.

## 2019-11-13 NOTE — Telephone Encounter (Signed)
Order in chart per Dr. Sharol Given for an urgent ESi with [facility] for L spine L2-3 and they can call to set this up for the pt asap.Marland Kitchen

## 2019-11-13 NOTE — Telephone Encounter (Signed)
Patient called and wanted advise from Dr. Sharol Given. Patient stated he wanted to make sure to schedule with Dr. Ernestina Patches for back injection. Patient will go ahead and schedule with [facility] and wait for response from Dr. Sharol Given. Patient phone number is  [phone].

## 2019-11-21 ENCOUNTER — Ambulatory Visit: Payer: Medicare Other

## 2019-11-21 ENCOUNTER — Other Ambulatory Visit: Payer: Self-pay

## 2019-11-21 ENCOUNTER — Encounter: Payer: Self-pay | Admitting: Physical Medicine and Rehabilitation

## 2019-11-21 ENCOUNTER — Ambulatory Visit (INDEPENDENT_AMBULATORY_CARE_PROVIDER_SITE_OTHER): Payer: Medicare Other | Admitting: Physical Medicine and Rehabilitation

## 2019-11-21 VITALS — BP 122/72 | HR 50

## 2019-11-21 DIAGNOSIS — L821 Other seborrheic keratosis: Secondary | ICD-10-CM | POA: Diagnosis not present

## 2019-11-21 DIAGNOSIS — M5416 Radiculopathy, lumbar region: Secondary | ICD-10-CM

## 2019-11-21 DIAGNOSIS — D225 Melanocytic nevi of trunk: Secondary | ICD-10-CM | POA: Diagnosis not present

## 2019-11-21 DIAGNOSIS — L570 Actinic keratosis: Secondary | ICD-10-CM | POA: Diagnosis not present

## 2019-11-21 DIAGNOSIS — Z85828 Personal history of other malignant neoplasm of skin: Secondary | ICD-10-CM | POA: Diagnosis not present

## 2019-11-21 DIAGNOSIS — L820 Inflamed seborrheic keratosis: Secondary | ICD-10-CM | POA: Diagnosis not present

## 2019-11-21 MED ORDER — METHYLPREDNISOLONE ACETATE 80 MG/ML IJ SUSP
40.0000 mg | Freq: Once | INTRAMUSCULAR | Status: AC
Start: 2019-11-21 — End: 2019-11-21
  Administered 2019-11-21: 40 mg

## 2019-11-21 MED ORDER — BACLOFEN 10 MG PO TABS: 10.0000 mg | ORAL_TABLET | Freq: Three times a day (TID) | ORAL | 0 refills | Status: DC | PRN

## 2019-11-21 NOTE — Progress Notes (Signed)
Pt states pain in the middle of the lower back. Pt states pain started years ago and last injection 09/02/19 did not help that much. Pt states laying on side and sitting for a long period of time makes pain worse. excedrin helps with pain.   .Numeric Pain Rating Scale and Functional Assessment Average Pain 7   In the last MONTH (on 0-10 scale) has pain interfered with the following?  1. General activity like being  able to carry out your everyday physical activities such as walking, climbing stairs, carrying groceries, or moving a chair?  Rating(7)   +Driver, -BT, -Dye Allergies.

## 2019-11-25 NOTE — Progress Notes (Signed)
Travis Hendricks - 79 y.o. male MRN [id number]  Date of birth: 31-Dec-1940  Office Visit Note: Visit Date: 11/21/2019 PCP: [name], MD Referred by: [name], MD  Subjective: Chief Complaint  Patient presents with  . Lower Back - Pain   HPI:  Travis Hendricks is a 79 y.o. male who comes in today For planned right L3 transforaminal epidural steroid injection.  Please see our prior notes for further details justification.  He did have a prior right L5 transforaminal epidural injection that he says certainly help that much but in point of fact he is not having as much thigh and leg pain as he had before.  He is having more middle back pain some referral to the thigh.  MRI at that time did note that he had some upper lumbar degenerative changes and lateral recess narrowing.  We elected to complete epidural injection at a higher level and see how he does diagnostically.  He rates his pain as a 7 out of 10 not responding to conservative care.  Would consider facet joint blocks diagnostically.  He will continue with current exercises and medication.  Baclofen was started as a trial of a muscle relaxer to see if that would help.  ROS Otherwise per HPI.  Assessment & Plan: Visit Diagnoses:  1. Lumbar radiculopathy     Plan: No additional findings.   Meds & Orders:  Meds ordered this encounter  Medications  . methylPREDNISolone acetate (DEPO-MEDROL) injection 40 mg  . baclofen (LIORESAL) 10 MG tablet    Sig: Take 1 tablet (10 mg total) by mouth every 8 (eight) hours as needed for muscle spasms (Pain).    Dispense:  60 tablet    Refill:  0    Orders Placed This Encounter  Procedures  . XR C-ARM NO REPORT  . Epidural Steroid injection    Follow-up: Return if symptoms worsen or fail to improve.   Procedures: No procedures performed  Lumbosacral Transforaminal Epidural Steroid Injection - Sub-Pedicular Approach with Fluoroscopic Guidance  Patient: Travis Hendricks      Date of Birth: 1941/01/18 MRN: [id number] PCP: [name], MD      Visit Date: 11/21/2019   Universal Protocol:    Date/Time: 11/21/2019  Consent Given By: the patient  Position: PRONE  Additional Comments: Vital signs were monitored before and after the procedure. Patient was prepped and draped in the usual sterile fashion. The correct patient, procedure, and site was verified.   Injection Procedure Details:  Procedure Site One Meds Administered:  Meds ordered this encounter  Medications  . methylPREDNISolone acetate (DEPO-MEDROL) injection 40 mg  . baclofen (LIORESAL) 10 MG tablet    Sig: Take 1 tablet (10 mg total) by mouth every 8 (eight) hours as needed for muscle spasms (Pain).    Dispense:  60 tablet    Refill:  0    Laterality: Right  Location/Site:  L3-L4  Needle size: 48 G  Needle type: Spinal  Needle Placement: Transforaminal  Findings:    -Comments: Excellent flow of contrast along the nerve and into the epidural space.  Procedure Details: After squaring off the end-plates to get a true AP view, the C-arm was positioned so that an oblique view of the foramen as noted above was visualized. The target area is just inferior to the "nose of the scotty dog" or sub pedicular. The soft tissues overlying this structure were infiltrated with 2-3 ml. of 1% Lidocaine without Epinephrine.  The spinal needle was inserted toward the target using a "trajectory" view along the fluoroscope beam.  Under AP and lateral visualization, the needle was advanced so it did not puncture dura and was located close the 6 O'Clock position of the pedical in AP tracterory. Biplanar projections were used to confirm position. Aspiration was confirmed to be negative for CSF and/or blood. A 1-2 ml. volume of Isovue-250 was injected and flow of contrast was noted at each level. Radiographs were obtained for documentation purposes.   After attaining the desired flow of  contrast documented above, a 0.5 to 1.0 ml test dose of 0.25% Marcaine was injected into each respective transforaminal space.  The patient was observed for 90 seconds post injection.  After no sensory deficits were reported, and normal lower extremity motor function was noted,   the above injectate was administered so that equal amounts of the injectate were placed at each foramen (level) into the transforaminal epidural space.   Additional Comments:  The patient tolerated the procedure well Dressing: 2 x 2 sterile gauze and Band-Aid    Post-procedure details: Patient was observed during the procedure. Post-procedure instructions were reviewed.  Patient left the clinic in stable condition.      Clinical History: MRI LUMBAR SPINE WITHOUT CONTRAST  TECHNIQUE: Multiplanar, multisequence MR imaging of the lumbar spine was performed. No intravenous contrast was administered.  COMPARISON:  01/09/2009  FINDINGS: Segmentation:  Standard lumbar numbering  Alignment:  Normal  Vertebrae: No evidence of fracture, discitis, or aggressive bone lesion  Conus medullaris and cauda equina: Conus extends to the L1 level. Conus and cauda equina appear normal.  Paraspinal and other soft tissues: Negative  Disc levels:  T12- L1: Unremarkable.  L1-L2: Disc bulging with posterior annular fissure. Negative facets. No impingement  L2-L3: Disc narrowing and bulging and posterior element hypertrophy. Bilateral subarticular recess stenosis, greater on the right where there is a 6 mm flat synovial cyst. The foramina are patent  L3-L4: Posterior element hypertrophy. Disc narrowing and bulging. Bilateral subarticular recess narrowing without static L4 compression. 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 There is endplate and facet spurring. Prior laminectomy with patent spinal canal. Patent  foramina  L5-S1:Disc narrowing and ridging. Minor facet spurring. No impingement.  IMPRESSION: 1. 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 2. 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 3. L3-4 mild spinal and bilateral subarticular recess stenosis. 4. L4-5 widely patent canal after laminectomy. 5. Diffusely patent foramina.   Electronically Signed   By: Monte Fantasia M.D.   On: 08/08/2019 04:17     Objective:  VS:  HT:    WT:   BMI:     BP:122/72  HR:(!) 50bpm  TEMP: ( )  RESP:  Physical Exam Constitutional:      General: He is not in acute distress.    Appearance: Normal appearance. He is not ill-appearing.  HENT:     Head: Normocephalic and atraumatic.     Right Ear: External ear normal.     Left Ear: External ear normal.  Eyes:     Extraocular Movements: Extraocular movements intact.  Cardiovascular:     Rate and Rhythm: Normal rate.     Pulses: Normal pulses.  Abdominal:     General: There is no distension.     Palpations: Abdomen is soft.  Musculoskeletal:  General: No tenderness or signs of injury.     Right lower leg: No edema.     Left lower leg: No edema.     Comments: Patient has good distal strength without clonus.  Skin:    Findings: No erythema or rash.  Neurological:     General: No focal deficit present.     Mental Status: He is alert and oriented to person, place, and time.     Sensory: No sensory deficit.     Motor: No weakness or abnormal muscle tone.     Coordination: Coordination normal.  Psychiatric:        Mood and Affect: Mood normal.        Behavior: Behavior normal.      Imaging: No results found.

## 2019-11-25 NOTE — Procedures (Signed)
Lumbosacral Transforaminal Epidural Steroid Injection - Sub-Pedicular Approach with Fluoroscopic Guidance  Patient: Travis Hendricks      Date of Birth: September 26, 1940 MRN: [id number] PCP: [name], MD      Visit Date: 11/21/2019   Universal Protocol:    Date/Time: 11/21/2019  Consent Given By: the patient  Position: PRONE  Additional Comments: Vital signs were monitored before and after the procedure. Patient was prepped and draped in the usual sterile fashion. The correct patient, procedure, and site was verified.   Injection Procedure Details:  Procedure Site One Meds Administered:  Meds ordered this encounter  Medications  . methylPREDNISolone acetate (DEPO-MEDROL) injection 40 mg  . baclofen (LIORESAL) 10 MG tablet    Sig: Take 1 tablet (10 mg total) by mouth every 8 (eight) hours as needed for muscle spasms (Pain).    Dispense:  60 tablet    Refill:  0    Laterality: Right  Location/Site:  L3-L4  Needle size: 58 G  Needle type: Spinal  Needle Placement: Transforaminal  Findings:    -Comments: Excellent flow of contrast along the nerve and into the epidural space.  Procedure Details: After squaring off the end-plates to get a true AP view, the C-arm was positioned so that an oblique view of the foramen as noted above was visualized. The target area is just inferior to the "nose of the scotty dog" or sub pedicular. The soft tissues overlying this structure were infiltrated with 2-3 ml. of 1% Lidocaine without Epinephrine.  The spinal needle was inserted toward the target using a "trajectory" view along the fluoroscope beam.  Under AP and lateral visualization, the needle was advanced so it did not puncture dura and was located close the 6 O'Clock position of the pedical in AP tracterory. Biplanar projections were used to confirm position. Aspiration was confirmed to be negative for CSF and/or blood. A 1-2 ml. volume of Isovue-250 was injected and flow of  contrast was noted at each level. Radiographs were obtained for documentation purposes.   After attaining the desired flow of contrast documented above, a 0.5 to 1.0 ml test dose of 0.25% Marcaine was injected into each respective transforaminal space.  The patient was observed for 90 seconds post injection.  After no sensory deficits were reported, and normal lower extremity motor function was noted,   the above injectate was administered so that equal amounts of the injectate were placed at each foramen (level) into the transforaminal epidural space.   Additional Comments:  The patient tolerated the procedure well Dressing: 2 x 2 sterile gauze and Band-Aid    Post-procedure details: Patient was observed during the procedure. Post-procedure instructions were reviewed.  Patient left the clinic in stable condition.

## 2020-01-08 ENCOUNTER — Telehealth: Payer: Self-pay

## 2020-01-08 ENCOUNTER — Other Ambulatory Visit: Payer: Self-pay

## 2020-01-08 DIAGNOSIS — G8929 Other chronic pain: Secondary | ICD-10-CM

## 2020-01-08 DIAGNOSIS — M5410 Radiculopathy, site unspecified: Secondary | ICD-10-CM

## 2020-01-08 NOTE — Telephone Encounter (Signed)
Per Dr. Sharol Given referral for neurosurgery to Dr. Vertell Limber made for this pt eval L spine. ESI not helpful still having pain.

## 2020-01-17 DIAGNOSIS — M479 Spondylosis, unspecified: Secondary | ICD-10-CM | POA: Diagnosis not present

## 2020-01-17 DIAGNOSIS — M5136 Other intervertebral disc degeneration, lumbar region: Secondary | ICD-10-CM | POA: Diagnosis not present

## 2020-01-17 DIAGNOSIS — G8929 Other chronic pain: Secondary | ICD-10-CM | POA: Insufficient documentation

## 2020-01-17 DIAGNOSIS — I10 Essential (primary) hypertension: Secondary | ICD-10-CM | POA: Insufficient documentation

## 2020-01-17 DIAGNOSIS — Z6825 Body mass index (BMI) 25.0-25.9, adult: Secondary | ICD-10-CM | POA: Insufficient documentation

## 2020-01-17 DIAGNOSIS — M5441 Lumbago with sciatica, right side: Secondary | ICD-10-CM | POA: Diagnosis not present

## 2020-01-17 DIAGNOSIS — M48061 Spinal stenosis, lumbar region without neurogenic claudication: Secondary | ICD-10-CM | POA: Diagnosis not present

## 2020-01-20 ENCOUNTER — Telehealth: Payer: Self-pay | Admitting: Physical Medicine and Rehabilitation

## 2020-01-20 NOTE — Telephone Encounter (Signed)
Dr. Romona Curls notes faxed to [facility] [phone]

## 2020-02-04 DIAGNOSIS — M4726 Other spondylosis with radiculopathy, lumbar region: Secondary | ICD-10-CM | POA: Diagnosis not present

## 2020-02-04 DIAGNOSIS — M5116 Intervertebral disc disorders with radiculopathy, lumbar region: Secondary | ICD-10-CM | POA: Diagnosis not present

## 2020-02-04 DIAGNOSIS — M48062 Spinal stenosis, lumbar region with neurogenic claudication: Secondary | ICD-10-CM | POA: Diagnosis not present

## 2020-02-21 DIAGNOSIS — C61 Malignant neoplasm of prostate: Secondary | ICD-10-CM | POA: Diagnosis not present

## 2020-02-28 DIAGNOSIS — N401 Enlarged prostate with lower urinary tract symptoms: Secondary | ICD-10-CM | POA: Diagnosis not present

## 2020-02-28 DIAGNOSIS — C61 Malignant neoplasm of prostate: Secondary | ICD-10-CM | POA: Diagnosis not present

## 2020-02-28 DIAGNOSIS — R350 Frequency of micturition: Secondary | ICD-10-CM | POA: Diagnosis not present

## 2020-02-28 DIAGNOSIS — N5201 Erectile dysfunction due to arterial insufficiency: Secondary | ICD-10-CM | POA: Diagnosis not present

## 2020-03-14 DIAGNOSIS — Z20822 Contact with and (suspected) exposure to covid-19: Secondary | ICD-10-CM | POA: Diagnosis not present

## 2020-03-16 DIAGNOSIS — Z20822 Contact with and (suspected) exposure to covid-19: Secondary | ICD-10-CM | POA: Diagnosis not present

## 2020-04-07 DIAGNOSIS — Z23 Encounter for immunization: Secondary | ICD-10-CM | POA: Diagnosis not present

## 2020-04-21 DIAGNOSIS — E559 Vitamin D deficiency, unspecified: Secondary | ICD-10-CM | POA: Diagnosis not present

## 2020-04-21 DIAGNOSIS — M353 Polymyalgia rheumatica: Secondary | ICD-10-CM | POA: Diagnosis not present

## 2020-04-21 DIAGNOSIS — Z7189 Other specified counseling: Secondary | ICD-10-CM | POA: Diagnosis not present

## 2020-04-21 DIAGNOSIS — E782 Mixed hyperlipidemia: Secondary | ICD-10-CM | POA: Diagnosis not present

## 2020-04-21 DIAGNOSIS — C61 Malignant neoplasm of prostate: Secondary | ICD-10-CM | POA: Diagnosis not present

## 2020-04-21 DIAGNOSIS — Z1389 Encounter for screening for other disorder: Secondary | ICD-10-CM | POA: Diagnosis not present

## 2020-04-21 DIAGNOSIS — M1000 Idiopathic gout, unspecified site: Secondary | ICD-10-CM | POA: Diagnosis not present

## 2020-04-21 DIAGNOSIS — H6123 Impacted cerumen, bilateral: Secondary | ICD-10-CM | POA: Diagnosis not present

## 2020-04-21 DIAGNOSIS — Z79899 Other long term (current) drug therapy: Secondary | ICD-10-CM | POA: Diagnosis not present

## 2020-04-21 DIAGNOSIS — N529 Male erectile dysfunction, unspecified: Secondary | ICD-10-CM | POA: Diagnosis not present

## 2020-04-21 DIAGNOSIS — Z1211 Encounter for screening for malignant neoplasm of colon: Secondary | ICD-10-CM | POA: Diagnosis not present

## 2020-04-21 DIAGNOSIS — Z0000 Encounter for general adult medical examination without abnormal findings: Secondary | ICD-10-CM | POA: Diagnosis not present

## 2020-04-21 DIAGNOSIS — Z23 Encounter for immunization: Secondary | ICD-10-CM | POA: Diagnosis not present

## 2020-04-22 DIAGNOSIS — Z1211 Encounter for screening for malignant neoplasm of colon: Secondary | ICD-10-CM | POA: Diagnosis not present

## 2020-05-21 DIAGNOSIS — Z20822 Contact with and (suspected) exposure to covid-19: Secondary | ICD-10-CM | POA: Diagnosis not present

## 2020-05-22 DIAGNOSIS — Z20822 Contact with and (suspected) exposure to covid-19: Secondary | ICD-10-CM | POA: Diagnosis not present

## 2020-07-13 ENCOUNTER — Other Ambulatory Visit: Payer: Self-pay | Admitting: Urology

## 2020-07-13 DIAGNOSIS — C61 Malignant neoplasm of prostate: Secondary | ICD-10-CM

## 2020-08-04 ENCOUNTER — Ambulatory Visit
Admission: RE | Admit: 2020-08-04 | Discharge: 2020-08-04 | Disposition: A | Discharge status: Home / Self Care | Payer: Medicare Other | Source: Ambulatory Visit | Attending: Urology | Admitting: Urology

## 2020-08-04 ENCOUNTER — Other Ambulatory Visit: Payer: Self-pay

## 2020-08-04 DIAGNOSIS — C61 Malignant neoplasm of prostate: Secondary | ICD-10-CM

## 2020-08-04 DIAGNOSIS — K5730 Diverticulosis of large intestine without perforation or abscess without bleeding: Secondary | ICD-10-CM | POA: Diagnosis not present

## 2020-08-04 DIAGNOSIS — N401 Enlarged prostate with lower urinary tract symptoms: Secondary | ICD-10-CM | POA: Diagnosis not present

## 2020-08-04 DIAGNOSIS — R972 Elevated prostate specific antigen [PSA]: Secondary | ICD-10-CM | POA: Diagnosis not present

## 2020-08-04 DIAGNOSIS — R590 Localized enlarged lymph nodes: Secondary | ICD-10-CM | POA: Diagnosis not present

## 2020-08-04 MED ORDER — GADOBENATE DIMEGLUMINE 529 MG/ML IV SOLN
15.0000 mL | Freq: Once | INTRAVENOUS | Status: AC | PRN
  Administered 2020-08-04: 15 mL via INTRAVENOUS

## 2020-08-11 DIAGNOSIS — Z961 Presence of intraocular lens: Secondary | ICD-10-CM | POA: Diagnosis not present

## 2020-08-11 DIAGNOSIS — H524 Presbyopia: Secondary | ICD-10-CM | POA: Diagnosis not present

## 2020-08-11 DIAGNOSIS — H35372 Puckering of macula, left eye: Secondary | ICD-10-CM | POA: Diagnosis not present

## 2020-08-12 DIAGNOSIS — C61 Malignant neoplasm of prostate: Secondary | ICD-10-CM | POA: Diagnosis not present

## 2020-09-14 ENCOUNTER — Encounter: Payer: Self-pay | Admitting: Radiation Oncology

## 2020-09-14 DIAGNOSIS — R1033 Periumbilical pain: Secondary | ICD-10-CM | POA: Insufficient documentation

## 2020-09-14 DIAGNOSIS — M1000 Idiopathic gout, unspecified site: Secondary | ICD-10-CM | POA: Insufficient documentation

## 2020-09-14 DIAGNOSIS — M5136 Other intervertebral disc degeneration, lumbar region: Secondary | ICD-10-CM | POA: Insufficient documentation

## 2020-09-14 DIAGNOSIS — Z0000 Encounter for general adult medical examination without abnormal findings: Secondary | ICD-10-CM | POA: Insufficient documentation

## 2020-09-14 DIAGNOSIS — Z1389 Encounter for screening for other disorder: Secondary | ICD-10-CM | POA: Insufficient documentation

## 2020-09-14 DIAGNOSIS — Z0001 Encounter for general adult medical examination with abnormal findings: Secondary | ICD-10-CM | POA: Insufficient documentation

## 2020-09-14 DIAGNOSIS — C61 Malignant neoplasm of prostate: Secondary | ICD-10-CM | POA: Insufficient documentation

## 2020-09-14 DIAGNOSIS — M5416 Radiculopathy, lumbar region: Secondary | ICD-10-CM | POA: Insufficient documentation

## 2020-09-14 DIAGNOSIS — N183 Chronic kidney disease, stage 3 unspecified: Secondary | ICD-10-CM | POA: Insufficient documentation

## 2020-09-14 DIAGNOSIS — Z79899 Other long term (current) drug therapy: Secondary | ICD-10-CM | POA: Insufficient documentation

## 2020-09-14 DIAGNOSIS — R634 Abnormal weight loss: Secondary | ICD-10-CM | POA: Insufficient documentation

## 2020-09-14 DIAGNOSIS — M353 Polymyalgia rheumatica: Secondary | ICD-10-CM | POA: Insufficient documentation

## 2020-09-14 DIAGNOSIS — N529 Male erectile dysfunction, unspecified: Secondary | ICD-10-CM | POA: Insufficient documentation

## 2020-09-14 DIAGNOSIS — R059 Cough, unspecified: Secondary | ICD-10-CM | POA: Insufficient documentation

## 2020-09-14 DIAGNOSIS — Z23 Encounter for immunization: Secondary | ICD-10-CM | POA: Insufficient documentation

## 2020-09-14 DIAGNOSIS — A09 Infectious gastroenteritis and colitis, unspecified: Secondary | ICD-10-CM | POA: Insufficient documentation

## 2020-09-14 DIAGNOSIS — E559 Vitamin D deficiency, unspecified: Secondary | ICD-10-CM | POA: Insufficient documentation

## 2020-09-14 DIAGNOSIS — R030 Elevated blood-pressure reading, without diagnosis of hypertension: Secondary | ICD-10-CM | POA: Insufficient documentation

## 2020-09-14 DIAGNOSIS — R1032 Left lower quadrant pain: Secondary | ICD-10-CM | POA: Insufficient documentation

## 2020-09-14 DIAGNOSIS — R197 Diarrhea, unspecified: Secondary | ICD-10-CM | POA: Insufficient documentation

## 2020-09-14 DIAGNOSIS — R972 Elevated prostate specific antigen [PSA]: Secondary | ICD-10-CM | POA: Insufficient documentation

## 2020-09-14 NOTE — Progress Notes (Signed)
GU Location of Tumor / Histology: prostatic adenocarcinoma  If Prostate Cancer, Gleason Score is (3 + 4) and PSA is (7.20). Prostate volume: 45.19 g.  Travis Hendricks was diagnosed with prostate cancer in June 2017 with a psa of 4.34.  Biopsies of prostate (if applicable) revealed:    Past/Anticipated interventions by urology, if any: diagnose, active surveillance, repeat surveillance biopsies, referral to Dr. Tammi Klippel to discuss radiation options  Past/Anticipated interventions by medical oncology, if any: no  Weight changes, if any: denies  Bowel/Bladder complaints, if any: IPSS 22. SHIM 20. Denies dysuria, hematuria, urinary leakage or incontinence. Denies any bowel complaints.   Nausea/Vomiting, if any: denies  Pain issues, if any:  denies  SAFETY ISSUES:  Prior radiation? denies  Pacemaker/ICD? denies  Possible current pregnancy? no, male patient  Is the patient on methotrexate? denies  Current Complaints / other details:  80 year old male. Married to [place]. 3 children. Resides in [place]. Mother - lung cancer. Brother - prostate cancer.

## 2020-09-14 NOTE — Progress Notes (Signed)
Radiation Oncology         [phone] ________________________________  Initial outpatient Consultation  Name: Travis Travis MRN: [id number]  Date: 09/15/2020  DOB: [birth year]  CC:Travis Limes, MD  Franchot Gallo, MD   REFERRING PHYSICIAN: Franchot Gallo, MD  DIAGNOSIS: 80 y.o. gentleman with Stage T1c adenocarcinoma of the prostate with Gleason score of 3+4, and PSA of 7.2.    ICD-10-CM   1. Prostate cancer [facility])  C61     HISTORY OF PRESENT ILLNESS: Travis Travis is a 80 y.o. male with a diagnosis of prostate cancer. He was initially diagnosed with Gleason 3+3 in 4/12 cores on 12/09/15 by Dr. Diona Fanti. PSA at diagnosis was 4.34. They opted for active surveillance. In summary, he underwent surveillance prostate MRI on 06/23/16 followed by fusion biopsy on 07/13/16 showing stable Gleason 3+3 disease. Surveillance prostate MRI on 05/31/18 followed by fusion biopsy on 07/13/18 showing one core with Gleason 3+4 and two with 3+3. He elected to continue in active surveillance at that time.  More recently, his PSA increased to 5.92 in 02/2020. He underwent a repeat prostate MRI on 08/04/20 showing a PIRADS 5 lesion in the left apical anterior transitional zone, increased in size since previous imaging as well as a small PIRADS 4 lesion in the left base without signs of extracapsular extension or metastatic disease. The prostate volume was estimated to be 35 cc. The patient proceeded to MRI fusion biopsy of the prostate on 08/12/20. PSA at that time had increased further to 7.2.   The prostate volume measured 45.19 cc by ultrasound.  Out of 20 core biopsies, 12 were positive.  The maximum Gleason score was 3+4, and this was seen in two samples from the left apex ROI and two samples from the left base ROI. Additionally, Gleason 3+3 was seen in the remaining cores from both ROI samples, as well as in the left mid lateral (small focus), left mid (small focus), left apex, and right apex  lateral.  The patient reviewed the biopsy results with his urologist and he has kindly been referred today for discussion of potential radiation treatment options.   PREVIOUS RADIATION THERAPY: No  PAST MEDICAL HISTORY:  Past Medical History:  Diagnosis Date  . Basal cell carcinoma   . Diverticulosis   . Other and unspecified hyperlipidemia   . Prostate cancer ([place])       PAST SURGICAL HISTORY: Past Surgical History:  Procedure Laterality Date  . CATARACT EXTRACTION W/ INTRAOCULAR LENS IMPLANT     bilaterally  . COLONOSCOPY     Tics  . ELBOW SURGERY     torn ligament  . HERNIA REPAIR    . KNEE ARTHROSCOPY    . VASECTOMY      FAMILY HISTORY:  Family History  Problem Relation Age of Onset  . Heart attack Father   . Lung cancer Mother   . Prostate cancer Brother   . Colon cancer Neg Hx   . Pancreatic cancer Neg Hx     SOCIAL HISTORY:  Social History   Socioeconomic History  . Marital status: Married    Spouse name: Travis Hendricks  . Number of children: 3  . Years of education: Not on file  . Highest education level: Not on file  Occupational History    Comment: retired  Tobacco Use  . Smoking status: Never Smoker  . Smokeless tobacco: Never Used  Vaping Use  . Vaping Use: Never used  Substance and Sexual Activity  .  Alcohol use: Yes    Alcohol/week: 14.0 standard drinks    Types: 7 Glasses of wine, 7 Shots of liquor per week  . Drug use: No  . Sexual activity: Yes  Other Topics Concern  . Not on file  Social History Narrative  . Not on file   Social Determinants of Health   Financial Resource Strain: Not on file  Food Insecurity: Not on file  Transportation Needs: Not on file  Physical Activity: Not on file  Stress: Not on file  Social Connections: Not on file  Intimate Partner Violence: Not on file    ALLERGIES: No known allergies  MEDICATIONS:  Current Outpatient Medications  Medication Sig Dispense Refill  . Ascorbic Acid (VITAMIN C) 1000  MG tablet Take 1,000 mg by mouth daily.    . cholecalciferol (VITAMIN D3) 25 MCG (1000 UNIT) tablet 1 tablet    . CRESTOR 20 MG tablet TAKE AS DIRECTED-LABS DUE 07/09/2012 (Patient taking differently: 10 mg.) 15 tablet 0  . Glucosamine-Chondroit-Vit C-Mn (GLUCOSAMINE CHONDR 1500 COMPLX PO) 1 capsule    . Multiple Vitamin (MULTIVITAMIN ADULT PO) multivitamin    . Omega-3 Fatty Acids (FISH OIL) 1000 MG CAPS Fish Oil    . tadalafil (CIALIS) 5 MG tablet Take 5 mg by mouth daily.    . TURMERIC PO turmeric    . vitamin E 45 MG (100 UNITS) capsule 1 capsule     No current facility-administered medications for this encounter.    REVIEW OF SYSTEMS:  On review of systems, the patient reports that he is doing well overall. He denies any chest pain, shortness of breath, cough, fevers, chills, night sweats, unintended weight changes. He denies any bowel disturbances, and denies abdominal pain, nausea or vomiting. He denies any new musculoskeletal or joint aches or pains. His IPSS was 22, indicating severe urinary symptoms with weak stream, intermittency, hesitancy, urgency, frequency, straining to start his stream and nocturia x2/night. His SHIM was 20, indicating he has mild erectile dysfunction. A complete review of systems is obtained and is otherwise negative.   PHYSICAL EXAM:  Wt Readings from Last 3 Encounters:  09/15/20 173 lb 4 oz (78.6 kg)  07/08/19 177 lb (80.3 kg)  05/20/19 177 lb (80.3 kg)   Temp Readings from Last 3 Encounters:  09/15/20 (!) 96.7 F (35.9 C) (Temporal)  06/28/11 97.4 F (36.3 C) (Oral)  06/22/11 97.7 F (36.5 C) (Oral)   BP Readings from Last 3 Encounters:  09/15/20 (!) 150/78  11/21/19 122/72  09/02/19 (!) 144/75   Pulse Readings from Last 3 Encounters:  09/15/20 (!) 48  11/21/19 (!) 50  09/02/19 (!) 44   Pain Assessment Pain Score: 0-No pain/10  In general this is a well appearing Caucasian male in no acute distress. He's alert and oriented x4 and  appropriate throughout the examination. Cardiopulmonary assessment is negative for acute distress, and he exhibits normal effort.     KPS = 100  100 - Normal; no complaints; no evidence of disease. 90   - Able to carry on normal activity; minor signs or symptoms of disease. 80   - Normal activity with effort; some signs or symptoms of disease. 110   - Cares for self; unable to carry on normal activity or to do active work. 60   - Requires occasional assistance, but is able to care for most of his personal needs. 50   - Requires considerable assistance and frequent medical care. 63   - Disabled; requires special  care and assistance. 59   - Severely disabled; hospital admission is indicated although death not imminent. 43   - Very sick; hospital admission necessary; active supportive treatment necessary. 10   - Moribund; fatal processes progressing rapidly. 0     - Dead  Karnofsky DA, Abelmann Tyro, Craver LS and Burchenal [facility] [phone]) The use of the nitrogen mustards in the palliative treatment of carcinoma: with particular reference to bronchogenic carcinoma Cancer 1 [phone]  LABORATORY DATA:  Lab Results  Component Value Date   WBC 4.6 06/28/2011   HGB 15.1 06/28/2011   HCT 44.4 06/28/2011   MCV 95.4 06/28/2011   PLT 235.0 06/28/2011   Lab Results  Component Value Date   NA 143 06/28/2011   K 5.0 06/28/2011   CL 107 06/28/2011   CO2 30 06/28/2011   Lab Results  Component Value Date   ALT 20 06/28/2011   AST 25 06/28/2011   ALKPHOS 47 06/28/2011   BILITOT 1.7 (H) 06/28/2011     RADIOGRAPHY: No results found.    IMPRESSION/PLAN: 1. 80 y.o. gentleman with Stage T1c adenocarcinoma of the prostate with Gleason Score of 3+4, and PSA of 7.2. We discussed the patient's workup and outlined the nature of prostate cancer in this setting. The patient's T stage, Gleason's score, and PSA put him into the favorable intermediate risk group. Accordingly, he is eligible for a variety of  potential treatment options including brachytherapy, 5.5 weeks of external radiation, or prostatectomy. We discussed the available radiation techniques, and focused on the details and logistics of delivery. He is not felt to be an ideal candidate for brachytherapy with his current severe urinary symptoms, most of which are obstructive. We discussed and outlined the risks, benefits, short and long-term effects associated with radiotherapy and compared and contrasted these with prostatectomy. We discussed the role of SpaceOAR gel in reducing the rectal toxicity associated with radiotherapy.  He and his wife were encouraged to ask questions that were answered to their stated satisfaction.  At the conclusion of our conversation, the patient is interested in moving forward with 5.5 weeks of external beam therapy. We will share our discussion with Dr. Diona Fanti and make arrangements for fiducial markers and SpaceOAR gel placement, prior to simulation, to reduce rectal toxicity from radiotherapy. The patient appears to have a good understanding of his disease and our treatment recommendations which are of curative intent and is in agreement with the stated plan. Therefore, we will move forward with treatment planning accordingly, in anticipation of beginning IMRT in the near future.    Nicholos Johns, PA-C    Tyler Pita, MD  [facility] Direct Dial: [phone]  Fax: [phone] [email or web address]  Skype  LinkedIn   This document serves as a record of services personally performed by Tyler Pita, MD and Freeman Caldron, PA-C. It was created on their behalf by Wilburn Mylar, a trained medical scribe. The creation of this record is based on the scribe's personal observations and the provider's statements to them. This document has been checked and approved by the attending provider.

## 2020-09-15 ENCOUNTER — Other Ambulatory Visit: Payer: Self-pay

## 2020-09-15 ENCOUNTER — Ambulatory Visit
Admission: RE | Admit: 2020-09-15 | Discharge: 2020-09-15 | Disposition: A | Discharge status: Home / Self Care | Payer: Medicare Other | Source: Ambulatory Visit | Attending: Radiation Oncology | Admitting: Radiation Oncology

## 2020-09-15 ENCOUNTER — Encounter: Payer: Self-pay | Admitting: Urology

## 2020-09-15 ENCOUNTER — Encounter: Payer: Self-pay | Admitting: Medical Oncology

## 2020-09-15 ENCOUNTER — Encounter: Payer: Self-pay | Admitting: Radiation Oncology

## 2020-09-15 VITALS — BP 150/78 | HR 48 | Temp 96.7°F | Resp 18 | Ht 68.0 in | Wt 173.2 lb

## 2020-09-15 DIAGNOSIS — C61 Malignant neoplasm of prostate: Secondary | ICD-10-CM

## 2020-09-15 DIAGNOSIS — Z8739 Personal history of other diseases of the musculoskeletal system and connective tissue: Secondary | ICD-10-CM | POA: Diagnosis not present

## 2020-09-15 DIAGNOSIS — Z8042 Family history of malignant neoplasm of prostate: Secondary | ICD-10-CM | POA: Diagnosis not present

## 2020-09-15 DIAGNOSIS — Z85820 Personal history of malignant melanoma of skin: Secondary | ICD-10-CM | POA: Insufficient documentation

## 2020-09-15 DIAGNOSIS — Z8546 Personal history of malignant neoplasm of prostate: Secondary | ICD-10-CM | POA: Diagnosis not present

## 2020-09-15 DIAGNOSIS — Z79899 Other long term (current) drug therapy: Secondary | ICD-10-CM | POA: Insufficient documentation

## 2020-09-15 DIAGNOSIS — M79642 Pain in left hand: Secondary | ICD-10-CM | POA: Diagnosis not present

## 2020-09-15 DIAGNOSIS — L409 Psoriasis, unspecified: Secondary | ICD-10-CM | POA: Diagnosis not present

## 2020-09-15 DIAGNOSIS — E785 Hyperlipidemia, unspecified: Secondary | ICD-10-CM | POA: Insufficient documentation

## 2020-09-15 DIAGNOSIS — M1990 Unspecified osteoarthritis, unspecified site: Secondary | ICD-10-CM | POA: Diagnosis not present

## 2020-09-15 DIAGNOSIS — Z801 Family history of malignant neoplasm of trachea, bronchus and lung: Secondary | ICD-10-CM | POA: Insufficient documentation

## 2020-09-15 DIAGNOSIS — M19042 Primary osteoarthritis, left hand: Secondary | ICD-10-CM | POA: Diagnosis not present

## 2020-09-15 DIAGNOSIS — M79643 Pain in unspecified hand: Secondary | ICD-10-CM | POA: Diagnosis not present

## 2020-09-15 DIAGNOSIS — M542 Cervicalgia: Secondary | ICD-10-CM | POA: Diagnosis not present

## 2020-09-15 DIAGNOSIS — M5136 Other intervertebral disc degeneration, lumbar region: Secondary | ICD-10-CM | POA: Diagnosis not present

## 2020-09-15 DIAGNOSIS — M79641 Pain in right hand: Secondary | ICD-10-CM | POA: Diagnosis not present

## 2020-09-15 DIAGNOSIS — M353 Polymyalgia rheumatica: Secondary | ICD-10-CM | POA: Diagnosis not present

## 2020-09-15 HISTORY — DX: Malignant neoplasm of prostate: C61

## 2020-09-15 NOTE — Progress Notes (Signed)
Introduce myself to patient and his wife as the prostate nurse navigator and discussed my role.  He was dianosed with prostate cancer in 2017 and has remained under active surveillance until his most recent biopsy 08/12/20. He is here to discuss his radiation treatment options. He has decided to move forward with 5.5 weeks of radiation. I gave them my business card and asked them to call me with questions or concerns.

## 2020-10-03 DIAGNOSIS — Z1152 Encounter for screening for COVID-19: Secondary | ICD-10-CM | POA: Diagnosis not present

## 2020-10-16 ENCOUNTER — Telehealth: Payer: Self-pay | Admitting: *Deleted

## 2020-10-16 NOTE — Telephone Encounter (Signed)
CALLED PATIENT TO INFORM OF FID. MARKERS AND SPACE OAR PLACEMENT ON 11-03-20 @ [facility] AND HIS SIM ON 11-06-20 - ARRIVAL TIME- 10:15 AM @ [facility], SPOKE WITH PATIENT AND HE IS AWARE OF THESE APPTS.

## 2020-11-03 DIAGNOSIS — C61 Malignant neoplasm of prostate: Secondary | ICD-10-CM | POA: Diagnosis not present

## 2020-11-05 ENCOUNTER — Telehealth: Payer: Self-pay | Admitting: *Deleted

## 2020-11-05 NOTE — Telephone Encounter (Signed)
Called patient to remind of sim for 11-06-20- arrival time - 10:15 am, spoke with patient and he is aware of this appt.

## 2020-11-06 ENCOUNTER — Other Ambulatory Visit: Payer: Self-pay

## 2020-11-06 ENCOUNTER — Ambulatory Visit
Admission: RE | Admit: 2020-11-06 | Discharge: 2020-11-06 | Disposition: A | Discharge status: Home / Self Care | Payer: Medicare Other | Source: Ambulatory Visit | Attending: Radiation Oncology | Admitting: Radiation Oncology

## 2020-11-06 ENCOUNTER — Encounter: Payer: Self-pay | Admitting: Medical Oncology

## 2020-11-06 DIAGNOSIS — C61 Malignant neoplasm of prostate: Secondary | ICD-10-CM | POA: Insufficient documentation

## 2020-11-06 DIAGNOSIS — Z510 Encounter for antineoplastic radiation therapy: Secondary | ICD-10-CM | POA: Diagnosis not present

## 2020-11-08 NOTE — Progress Notes (Signed)
  Radiation Oncology         [phone] ________________________________  Name: Travis Hendricks MRN: [id number]  Date: 11/06/2020  DOB: [birth year]  SIMULATION AND TREATMENT PLANNING NOTE    ICD-10-CM   1. Malignant neoplasm of prostate ([place])  C61     DIAGNOSIS:  80 y.o. gentleman with Stage T1c adenocarcinoma of the prostate with Gleason score of 3+4, and PSA of 7.2.  NARRATIVE:  The patient was brought to the [facility].  Identity was confirmed.  All relevant records and images related to the planned course of therapy were reviewed.  The patient freely provided informed written consent to proceed with treatment after reviewing the details related to the planned course of therapy. The consent form was witnessed and verified by the simulation staff.  Then, the patient was set-up in a stable reproducible supine position for radiation therapy.  A vacuum lock pillow device was custom fabricated to position his legs in a reproducible immobilized position.  Then, I performed a urethrogram under sterile conditions to identify the prostatic apex.  CT images were obtained.  Surface markings were placed.  The CT images were loaded into the planning software.  Then the prostate target and avoidance structures including the rectum, bladder, bowel and hips were contoured.  Treatment planning then occurred.  The radiation prescription was entered and confirmed.  A total of one complex treatment devices was fabricated. I have requested : Intensity Modulated Radiotherapy (IMRT) is medically necessary for this case for the following reason:  Rectal sparing.Marland Kitchen  PLAN:  The patient will receive 70 Gy in 28 fractions.  ________________________________  Sheral Apley Tammi Klippel, M.D.

## 2020-11-10 DIAGNOSIS — C61 Malignant neoplasm of prostate: Secondary | ICD-10-CM | POA: Diagnosis not present

## 2020-11-10 DIAGNOSIS — Z510 Encounter for antineoplastic radiation therapy: Secondary | ICD-10-CM | POA: Diagnosis not present

## 2020-11-17 ENCOUNTER — Other Ambulatory Visit: Payer: Self-pay

## 2020-11-17 ENCOUNTER — Ambulatory Visit
Admission: RE | Admit: 2020-11-17 | Discharge: 2020-11-17 | Disposition: A | Discharge status: Home / Self Care | Payer: Medicare Other | Source: Ambulatory Visit | Attending: Radiation Oncology | Admitting: Radiation Oncology

## 2020-11-17 DIAGNOSIS — Z510 Encounter for antineoplastic radiation therapy: Secondary | ICD-10-CM | POA: Diagnosis not present

## 2020-11-17 DIAGNOSIS — C61 Malignant neoplasm of prostate: Secondary | ICD-10-CM | POA: Diagnosis not present

## 2020-11-18 ENCOUNTER — Ambulatory Visit
Admission: RE | Admit: 2020-11-18 | Discharge: 2020-11-18 | Disposition: A | Discharge status: Home / Self Care | Payer: Medicare Other | Source: Ambulatory Visit | Attending: Radiation Oncology | Admitting: Radiation Oncology

## 2020-11-18 ENCOUNTER — Other Ambulatory Visit: Payer: Self-pay

## 2020-11-18 DIAGNOSIS — C61 Malignant neoplasm of prostate: Secondary | ICD-10-CM | POA: Insufficient documentation

## 2020-11-18 DIAGNOSIS — Z510 Encounter for antineoplastic radiation therapy: Secondary | ICD-10-CM | POA: Diagnosis not present

## 2020-11-19 ENCOUNTER — Ambulatory Visit
Admission: RE | Admit: 2020-11-19 | Discharge: 2020-11-19 | Disposition: A | Discharge status: Home / Self Care | Payer: Medicare Other | Source: Ambulatory Visit | Attending: Radiation Oncology | Admitting: Radiation Oncology

## 2020-11-19 DIAGNOSIS — M353 Polymyalgia rheumatica: Secondary | ICD-10-CM | POA: Diagnosis not present

## 2020-11-19 DIAGNOSIS — Z510 Encounter for antineoplastic radiation therapy: Secondary | ICD-10-CM | POA: Diagnosis not present

## 2020-11-19 DIAGNOSIS — E663 Overweight: Secondary | ICD-10-CM | POA: Diagnosis not present

## 2020-11-19 DIAGNOSIS — M1A09X0 Idiopathic chronic gout, multiple sites, without tophus (tophi): Secondary | ICD-10-CM | POA: Diagnosis not present

## 2020-11-19 DIAGNOSIS — M7989 Other specified soft tissue disorders: Secondary | ICD-10-CM | POA: Diagnosis not present

## 2020-11-19 DIAGNOSIS — Z6826 Body mass index (BMI) 26.0-26.9, adult: Secondary | ICD-10-CM | POA: Diagnosis not present

## 2020-11-19 DIAGNOSIS — C61 Malignant neoplasm of prostate: Secondary | ICD-10-CM | POA: Diagnosis not present

## 2020-11-19 DIAGNOSIS — M150 Primary generalized (osteo)arthritis: Secondary | ICD-10-CM | POA: Diagnosis not present

## 2020-11-19 DIAGNOSIS — M5136 Other intervertebral disc degeneration, lumbar region: Secondary | ICD-10-CM | POA: Diagnosis not present

## 2020-11-20 ENCOUNTER — Ambulatory Visit
Admission: RE | Admit: 2020-11-20 | Discharge: 2020-11-20 | Disposition: A | Discharge status: Home / Self Care | Payer: Medicare Other | Source: Ambulatory Visit | Attending: Radiation Oncology | Admitting: Radiation Oncology

## 2020-11-20 DIAGNOSIS — Z510 Encounter for antineoplastic radiation therapy: Secondary | ICD-10-CM | POA: Diagnosis not present

## 2020-11-20 DIAGNOSIS — C61 Malignant neoplasm of prostate: Secondary | ICD-10-CM | POA: Diagnosis not present

## 2020-11-23 ENCOUNTER — Ambulatory Visit: Payer: Medicare Other

## 2020-11-24 ENCOUNTER — Ambulatory Visit
Admission: RE | Admit: 2020-11-24 | Discharge: 2020-11-24 | Disposition: A | Discharge status: Home / Self Care | Payer: Medicare Other | Source: Ambulatory Visit | Attending: Radiation Oncology | Admitting: Radiation Oncology

## 2020-11-24 ENCOUNTER — Other Ambulatory Visit: Payer: Self-pay

## 2020-11-24 DIAGNOSIS — C61 Malignant neoplasm of prostate: Secondary | ICD-10-CM | POA: Diagnosis not present

## 2020-11-24 DIAGNOSIS — Z510 Encounter for antineoplastic radiation therapy: Secondary | ICD-10-CM | POA: Diagnosis not present

## 2020-11-25 ENCOUNTER — Ambulatory Visit
Admission: RE | Admit: 2020-11-25 | Discharge: 2020-11-25 | Disposition: A | Discharge status: Home / Self Care | Payer: Medicare Other | Source: Ambulatory Visit | Attending: Radiation Oncology | Admitting: Radiation Oncology

## 2020-11-25 DIAGNOSIS — L820 Inflamed seborrheic keratosis: Secondary | ICD-10-CM | POA: Diagnosis not present

## 2020-11-25 DIAGNOSIS — L821 Other seborrheic keratosis: Secondary | ICD-10-CM | POA: Diagnosis not present

## 2020-11-25 DIAGNOSIS — Z510 Encounter for antineoplastic radiation therapy: Secondary | ICD-10-CM | POA: Diagnosis not present

## 2020-11-25 DIAGNOSIS — Z85828 Personal history of other malignant neoplasm of skin: Secondary | ICD-10-CM | POA: Diagnosis not present

## 2020-11-25 DIAGNOSIS — L570 Actinic keratosis: Secondary | ICD-10-CM | POA: Diagnosis not present

## 2020-11-25 DIAGNOSIS — L812 Freckles: Secondary | ICD-10-CM | POA: Diagnosis not present

## 2020-11-25 DIAGNOSIS — C61 Malignant neoplasm of prostate: Secondary | ICD-10-CM | POA: Diagnosis not present

## 2020-11-25 DIAGNOSIS — D1801 Hemangioma of skin and subcutaneous tissue: Secondary | ICD-10-CM | POA: Diagnosis not present

## 2020-11-25 DIAGNOSIS — B351 Tinea unguium: Secondary | ICD-10-CM | POA: Diagnosis not present

## 2020-11-26 ENCOUNTER — Ambulatory Visit
Admission: RE | Admit: 2020-11-26 | Discharge: 2020-11-26 | Disposition: A | Discharge status: Home / Self Care | Payer: Medicare Other | Source: Ambulatory Visit | Attending: Radiation Oncology | Admitting: Radiation Oncology

## 2020-11-26 ENCOUNTER — Other Ambulatory Visit: Payer: Self-pay

## 2020-11-26 DIAGNOSIS — C61 Malignant neoplasm of prostate: Secondary | ICD-10-CM | POA: Diagnosis not present

## 2020-11-26 DIAGNOSIS — Z510 Encounter for antineoplastic radiation therapy: Secondary | ICD-10-CM | POA: Diagnosis not present

## 2020-11-27 ENCOUNTER — Ambulatory Visit
Admission: RE | Admit: 2020-11-27 | Discharge: 2020-11-27 | Disposition: A | Discharge status: Home / Self Care | Payer: Medicare Other | Source: Ambulatory Visit | Attending: Radiation Oncology | Admitting: Radiation Oncology

## 2020-11-27 DIAGNOSIS — C61 Malignant neoplasm of prostate: Secondary | ICD-10-CM | POA: Diagnosis not present

## 2020-11-27 DIAGNOSIS — Z510 Encounter for antineoplastic radiation therapy: Secondary | ICD-10-CM | POA: Diagnosis not present

## 2020-11-30 ENCOUNTER — Ambulatory Visit
Admission: RE | Admit: 2020-11-30 | Discharge: 2020-11-30 | Disposition: A | Discharge status: Home / Self Care | Payer: Medicare Other | Source: Ambulatory Visit | Attending: Radiation Oncology | Admitting: Radiation Oncology

## 2020-11-30 ENCOUNTER — Other Ambulatory Visit: Payer: Self-pay

## 2020-11-30 DIAGNOSIS — C61 Malignant neoplasm of prostate: Secondary | ICD-10-CM | POA: Diagnosis not present

## 2020-11-30 DIAGNOSIS — Z510 Encounter for antineoplastic radiation therapy: Secondary | ICD-10-CM | POA: Diagnosis not present

## 2020-12-01 ENCOUNTER — Ambulatory Visit
Admission: RE | Admit: 2020-12-01 | Discharge: 2020-12-01 | Disposition: A | Discharge status: Home / Self Care | Payer: Medicare Other | Source: Ambulatory Visit | Attending: Radiation Oncology | Admitting: Radiation Oncology

## 2020-12-01 DIAGNOSIS — Z510 Encounter for antineoplastic radiation therapy: Secondary | ICD-10-CM | POA: Diagnosis not present

## 2020-12-01 DIAGNOSIS — C61 Malignant neoplasm of prostate: Secondary | ICD-10-CM | POA: Diagnosis not present

## 2020-12-02 ENCOUNTER — Other Ambulatory Visit: Payer: Self-pay

## 2020-12-02 ENCOUNTER — Ambulatory Visit
Admission: RE | Admit: 2020-12-02 | Discharge: 2020-12-02 | Disposition: A | Discharge status: Home / Self Care | Payer: Medicare Other | Source: Ambulatory Visit | Attending: Radiation Oncology | Admitting: Radiation Oncology

## 2020-12-02 DIAGNOSIS — Z510 Encounter for antineoplastic radiation therapy: Secondary | ICD-10-CM | POA: Diagnosis not present

## 2020-12-02 DIAGNOSIS — C61 Malignant neoplasm of prostate: Secondary | ICD-10-CM | POA: Diagnosis not present

## 2020-12-03 ENCOUNTER — Ambulatory Visit
Admission: RE | Admit: 2020-12-03 | Discharge: 2020-12-03 | Disposition: A | Discharge status: Home / Self Care | Payer: Medicare Other | Source: Ambulatory Visit | Attending: Radiation Oncology | Admitting: Radiation Oncology

## 2020-12-03 DIAGNOSIS — C61 Malignant neoplasm of prostate: Secondary | ICD-10-CM | POA: Diagnosis not present

## 2020-12-03 DIAGNOSIS — Z510 Encounter for antineoplastic radiation therapy: Secondary | ICD-10-CM | POA: Diagnosis not present

## 2020-12-04 ENCOUNTER — Ambulatory Visit
Admission: RE | Admit: 2020-12-04 | Discharge: 2020-12-04 | Disposition: A | Discharge status: Home / Self Care | Payer: Medicare Other | Source: Ambulatory Visit | Attending: Radiation Oncology | Admitting: Radiation Oncology

## 2020-12-04 ENCOUNTER — Other Ambulatory Visit: Payer: Self-pay | Admitting: Radiation Oncology

## 2020-12-04 ENCOUNTER — Other Ambulatory Visit: Payer: Self-pay

## 2020-12-04 DIAGNOSIS — C61 Malignant neoplasm of prostate: Secondary | ICD-10-CM | POA: Diagnosis not present

## 2020-12-04 DIAGNOSIS — Z510 Encounter for antineoplastic radiation therapy: Secondary | ICD-10-CM | POA: Diagnosis not present

## 2020-12-04 MED ORDER — TAMSULOSIN HCL 0.4 MG PO CAPS: 0.4000 mg | ORAL_CAPSULE | Freq: Every day | ORAL | 5 refills | Status: DC

## 2020-12-07 ENCOUNTER — Ambulatory Visit
Admission: RE | Admit: 2020-12-07 | Discharge: 2020-12-07 | Disposition: A | Discharge status: Home / Self Care | Payer: Medicare Other | Source: Ambulatory Visit | Attending: Radiation Oncology | Admitting: Radiation Oncology

## 2020-12-07 DIAGNOSIS — C61 Malignant neoplasm of prostate: Secondary | ICD-10-CM | POA: Diagnosis not present

## 2020-12-07 DIAGNOSIS — Z510 Encounter for antineoplastic radiation therapy: Secondary | ICD-10-CM | POA: Diagnosis not present

## 2020-12-08 ENCOUNTER — Other Ambulatory Visit: Payer: Self-pay

## 2020-12-08 ENCOUNTER — Ambulatory Visit
Admission: RE | Admit: 2020-12-08 | Discharge: 2020-12-08 | Disposition: A | Discharge status: Home / Self Care | Payer: Medicare Other | Source: Ambulatory Visit | Attending: Radiation Oncology | Admitting: Radiation Oncology

## 2020-12-08 DIAGNOSIS — C61 Malignant neoplasm of prostate: Secondary | ICD-10-CM | POA: Diagnosis not present

## 2020-12-08 DIAGNOSIS — Z510 Encounter for antineoplastic radiation therapy: Secondary | ICD-10-CM | POA: Diagnosis not present

## 2020-12-09 ENCOUNTER — Ambulatory Visit
Admission: RE | Admit: 2020-12-09 | Discharge: 2020-12-09 | Disposition: A | Discharge status: Home / Self Care | Payer: Medicare Other | Source: Ambulatory Visit | Attending: Radiation Oncology | Admitting: Radiation Oncology

## 2020-12-09 DIAGNOSIS — C61 Malignant neoplasm of prostate: Secondary | ICD-10-CM | POA: Diagnosis not present

## 2020-12-09 DIAGNOSIS — Z510 Encounter for antineoplastic radiation therapy: Secondary | ICD-10-CM | POA: Diagnosis not present

## 2020-12-10 ENCOUNTER — Ambulatory Visit
Admission: RE | Admit: 2020-12-10 | Discharge: 2020-12-10 | Disposition: A | Discharge status: Home / Self Care | Payer: Medicare Other | Source: Ambulatory Visit | Attending: Radiation Oncology | Admitting: Radiation Oncology

## 2020-12-10 DIAGNOSIS — Z510 Encounter for antineoplastic radiation therapy: Secondary | ICD-10-CM | POA: Diagnosis not present

## 2020-12-10 DIAGNOSIS — C61 Malignant neoplasm of prostate: Secondary | ICD-10-CM | POA: Diagnosis not present

## 2020-12-11 ENCOUNTER — Other Ambulatory Visit: Payer: Self-pay

## 2020-12-11 ENCOUNTER — Ambulatory Visit
Admission: RE | Admit: 2020-12-11 | Discharge: 2020-12-11 | Disposition: A | Discharge status: Home / Self Care | Payer: Medicare Other | Source: Ambulatory Visit | Attending: Radiation Oncology | Admitting: Radiation Oncology

## 2020-12-11 DIAGNOSIS — C61 Malignant neoplasm of prostate: Secondary | ICD-10-CM | POA: Diagnosis not present

## 2020-12-11 DIAGNOSIS — Z510 Encounter for antineoplastic radiation therapy: Secondary | ICD-10-CM | POA: Diagnosis not present

## 2020-12-14 ENCOUNTER — Ambulatory Visit
Admission: RE | Admit: 2020-12-14 | Discharge: 2020-12-14 | Disposition: A | Discharge status: Home / Self Care | Payer: Medicare Other | Source: Ambulatory Visit | Attending: Radiation Oncology | Admitting: Radiation Oncology

## 2020-12-14 DIAGNOSIS — C61 Malignant neoplasm of prostate: Secondary | ICD-10-CM | POA: Diagnosis not present

## 2020-12-14 DIAGNOSIS — Z510 Encounter for antineoplastic radiation therapy: Secondary | ICD-10-CM | POA: Diagnosis not present

## 2020-12-15 ENCOUNTER — Ambulatory Visit
Admission: RE | Admit: 2020-12-15 | Discharge: 2020-12-15 | Disposition: A | Discharge status: Home / Self Care | Payer: Medicare Other | Source: Ambulatory Visit | Attending: Radiation Oncology | Admitting: Radiation Oncology

## 2020-12-15 ENCOUNTER — Other Ambulatory Visit: Payer: Self-pay

## 2020-12-15 DIAGNOSIS — Z510 Encounter for antineoplastic radiation therapy: Secondary | ICD-10-CM | POA: Diagnosis not present

## 2020-12-15 DIAGNOSIS — C61 Malignant neoplasm of prostate: Secondary | ICD-10-CM | POA: Diagnosis not present

## 2020-12-16 ENCOUNTER — Ambulatory Visit
Admission: RE | Admit: 2020-12-16 | Discharge: 2020-12-16 | Disposition: A | Discharge status: Home / Self Care | Payer: Medicare Other | Source: Ambulatory Visit | Attending: Radiation Oncology | Admitting: Radiation Oncology

## 2020-12-16 DIAGNOSIS — Z510 Encounter for antineoplastic radiation therapy: Secondary | ICD-10-CM | POA: Diagnosis not present

## 2020-12-16 DIAGNOSIS — C61 Malignant neoplasm of prostate: Secondary | ICD-10-CM | POA: Diagnosis not present

## 2020-12-17 ENCOUNTER — Other Ambulatory Visit: Payer: Self-pay

## 2020-12-17 ENCOUNTER — Ambulatory Visit
Admission: RE | Admit: 2020-12-17 | Discharge: 2020-12-17 | Disposition: A | Discharge status: Home / Self Care | Payer: Medicare Other | Source: Ambulatory Visit | Attending: Radiation Oncology | Admitting: Radiation Oncology

## 2020-12-17 DIAGNOSIS — C61 Malignant neoplasm of prostate: Secondary | ICD-10-CM | POA: Diagnosis not present

## 2020-12-17 DIAGNOSIS — Z510 Encounter for antineoplastic radiation therapy: Secondary | ICD-10-CM | POA: Diagnosis not present

## 2020-12-18 ENCOUNTER — Ambulatory Visit
Admission: RE | Admit: 2020-12-18 | Discharge: 2020-12-18 | Disposition: A | Discharge status: Home / Self Care | Payer: Medicare Other | Source: Ambulatory Visit | Attending: Radiation Oncology | Admitting: Radiation Oncology

## 2020-12-18 DIAGNOSIS — C61 Malignant neoplasm of prostate: Secondary | ICD-10-CM | POA: Diagnosis not present

## 2020-12-18 DIAGNOSIS — Z510 Encounter for antineoplastic radiation therapy: Secondary | ICD-10-CM | POA: Insufficient documentation

## 2020-12-22 ENCOUNTER — Other Ambulatory Visit: Payer: Self-pay

## 2020-12-22 ENCOUNTER — Ambulatory Visit
Admission: RE | Admit: 2020-12-22 | Discharge: 2020-12-22 | Disposition: A | Discharge status: Home / Self Care | Payer: Medicare Other | Source: Ambulatory Visit | Attending: Radiation Oncology | Admitting: Radiation Oncology

## 2020-12-22 DIAGNOSIS — C61 Malignant neoplasm of prostate: Secondary | ICD-10-CM | POA: Diagnosis not present

## 2020-12-22 DIAGNOSIS — Z510 Encounter for antineoplastic radiation therapy: Secondary | ICD-10-CM | POA: Diagnosis not present

## 2020-12-23 ENCOUNTER — Ambulatory Visit
Admission: RE | Admit: 2020-12-23 | Discharge: 2020-12-23 | Disposition: A | Discharge status: Home / Self Care | Payer: Medicare Other | Source: Ambulatory Visit | Attending: Radiation Oncology | Admitting: Radiation Oncology

## 2020-12-23 DIAGNOSIS — C61 Malignant neoplasm of prostate: Secondary | ICD-10-CM | POA: Diagnosis not present

## 2020-12-23 DIAGNOSIS — Z510 Encounter for antineoplastic radiation therapy: Secondary | ICD-10-CM | POA: Diagnosis not present

## 2020-12-24 ENCOUNTER — Ambulatory Visit
Admission: RE | Admit: 2020-12-24 | Discharge: 2020-12-24 | Disposition: A | Discharge status: Home / Self Care | Payer: Medicare Other | Source: Ambulatory Visit | Attending: Radiation Oncology | Admitting: Radiation Oncology

## 2020-12-24 ENCOUNTER — Other Ambulatory Visit: Payer: Self-pay

## 2020-12-24 DIAGNOSIS — Z510 Encounter for antineoplastic radiation therapy: Secondary | ICD-10-CM | POA: Diagnosis not present

## 2020-12-24 DIAGNOSIS — C61 Malignant neoplasm of prostate: Secondary | ICD-10-CM | POA: Diagnosis not present

## 2020-12-25 ENCOUNTER — Ambulatory Visit
Admission: RE | Admit: 2020-12-25 | Discharge: 2020-12-25 | Disposition: A | Discharge status: Home / Self Care | Payer: Medicare Other | Source: Ambulatory Visit | Attending: Radiation Oncology | Admitting: Radiation Oncology

## 2020-12-25 ENCOUNTER — Ambulatory Visit: Payer: Medicare Other

## 2020-12-25 DIAGNOSIS — C61 Malignant neoplasm of prostate: Secondary | ICD-10-CM | POA: Diagnosis not present

## 2020-12-25 DIAGNOSIS — Z510 Encounter for antineoplastic radiation therapy: Secondary | ICD-10-CM | POA: Diagnosis not present

## 2020-12-28 ENCOUNTER — Ambulatory Visit
Admission: RE | Admit: 2020-12-28 | Discharge: 2020-12-28 | Disposition: A | Discharge status: Home / Self Care | Payer: Medicare Other | Source: Ambulatory Visit | Attending: Radiation Oncology | Admitting: Radiation Oncology

## 2020-12-28 ENCOUNTER — Encounter: Payer: Self-pay | Admitting: Urology

## 2020-12-28 ENCOUNTER — Other Ambulatory Visit: Payer: Self-pay

## 2020-12-28 DIAGNOSIS — Z510 Encounter for antineoplastic radiation therapy: Secondary | ICD-10-CM | POA: Diagnosis not present

## 2020-12-28 DIAGNOSIS — C61 Malignant neoplasm of prostate: Secondary | ICD-10-CM | POA: Diagnosis not present

## 2021-01-20 DIAGNOSIS — M7989 Other specified soft tissue disorders: Secondary | ICD-10-CM | POA: Diagnosis not present

## 2021-01-20 DIAGNOSIS — E663 Overweight: Secondary | ICD-10-CM | POA: Diagnosis not present

## 2021-01-20 DIAGNOSIS — M5136 Other intervertebral disc degeneration, lumbar region: Secondary | ICD-10-CM | POA: Diagnosis not present

## 2021-01-20 DIAGNOSIS — M353 Polymyalgia rheumatica: Secondary | ICD-10-CM | POA: Diagnosis not present

## 2021-01-20 DIAGNOSIS — M150 Primary generalized (osteo)arthritis: Secondary | ICD-10-CM | POA: Diagnosis not present

## 2021-01-20 DIAGNOSIS — Z6825 Body mass index (BMI) 25.0-25.9, adult: Secondary | ICD-10-CM | POA: Diagnosis not present

## 2021-01-20 DIAGNOSIS — M1A09X0 Idiopathic chronic gout, multiple sites, without tophus (tophi): Secondary | ICD-10-CM | POA: Diagnosis not present

## 2021-02-09 ENCOUNTER — Encounter: Payer: Self-pay | Admitting: Urology

## 2021-02-09 NOTE — Progress Notes (Signed)
Patient reports nocturia x2-3 w/ some urgency. Denies pain, fatigue, dysuria, hematuria, frequency, urinary stream weakness, diarrhea, constipation, and skin changes.  I-PSS score of 4 (mild). Meaningful use questions complete and patient notified of 8:30am telephone visit on 02/11/21 and expressed understanding.

## 2021-02-10 NOTE — Progress Notes (Signed)
  Radiation Oncology         [phone] ________________________________  Name: Travis Hendricks MRN: [id number]  Date: 12/28/2020  DOB: 1941-01-19  End of Treatment Note  Diagnosis:   80 y.o. gentleman with Stage T1c adenocarcinoma of the prostate with Gleason score of 3+4, and PSA of 7.2.     Indication for treatment:  Curative, Definitive Radiotherapy       Radiation treatment dates:   11/17/20 - 12/28/20  Site/dose:   The prostate was treated to 70 Gy in 28 fractions of 2.5 Gy  Beams/energy:   The patient was treated with IMRT using volumetric arc therapy delivering 6 MV X-rays to clockwise and counterclockwise circumferential arcs with a 90 degree collimator offset to avoid dose scalloping.  Image guidance was performed with daily cone beam CT prior to each fraction to align to gold markers in the prostate and assure proper bladder and rectal fill volumes.  Immobilization was achieved with BodyFix custom mold.  Narrative: The patient tolerated radiation treatment relatively well with only minor urinary irritation and modest fatigue.  He did report increased frequency, nocturia 3-4x/night, dysuria, hesitancy, intermittency, and incomplete emptying but these improved on Flomax. He also reported lose stools.  Plan: The patient has completed radiation treatment. He will return to radiation oncology clinic for routine followup in one month. I advised him to call or return sooner if he has any questions or concerns related to his recovery or treatment. ________________________________  Sheral Apley. Tammi Klippel, M.D.

## 2021-02-11 ENCOUNTER — Ambulatory Visit
Admission: RE | Admit: 2021-02-11 | Discharge: 2021-02-11 | Disposition: A | Discharge status: Home / Self Care | Payer: Medicare Other | Source: Ambulatory Visit | Attending: Urology | Admitting: Urology

## 2021-02-11 DIAGNOSIS — C61 Malignant neoplasm of prostate: Secondary | ICD-10-CM

## 2021-02-11 NOTE — Progress Notes (Signed)
Radiation Oncology         [phone] ________________________________  Name: Travis Hendricks MRN: [id number]  Date: 02/11/2021  DOB: Jul 27, 1940  Post Treatment Note  CC: [name], MD  Franchot Gallo, MD  Diagnosis:   80 y.o. gentleman with Stage T1c adenocarcinoma of the prostate with Gleason score of 3+4, and PSA of 7.2.    Interval Since Last Radiation:  6.5 weeks  11/17/20 - 12/28/20: The prostate was treated to 70 Gy in 28 fractions of 2.5 Gy  Narrative:  I spoke with the patient to conduct his routine scheduled 1 month follow up visit via telephone to spare the patient unnecessary potential exposure in the healthcare setting during the current COVID-19 pandemic.  The patient was notified in advance and gave permission to proceed with this visit format.  He tolerated radiation treatment relatively well with only minor urinary irritation and modest fatigue.  He did report increased frequency, nocturia 3-4x/night, dysuria, hesitancy, intermittency, and incomplete emptying but these improved on Flomax. He also reported lose stools.                              On review of systems, the patient states that he is doing well in general. His LUTS are gradually improving and almost back to his baseline at this point with an IPSS of 4 currently. He continues with nocturia 2-3x/night and mild increased urgency but specifically denies dysuria, gross hematuria, straining to void, incomplete emptying or incontinence. He continues taking Flomax daily as prescribed. He denies abdominal pain, N/V, diarrhea or constipation and reports a healthy appetite. His energy level is still decreased in the evenings but he has been able to remain very active, playing pickle ball daily. Overall, he is quite pleased with his progress to date.  ALLERGIES:  is allergic to no known allergies.  Meds: Current Outpatient Medications  Medication Sig Dispense Refill   Ascorbic Acid (VITAMIN C) 1000 MG tablet  Take 1,000 mg by mouth daily.     cholecalciferol (VITAMIN D3) 25 MCG (1000 UNIT) tablet 1 tablet     CRESTOR 20 MG tablet TAKE AS DIRECTED-LABS DUE 07/09/2012 (Patient taking differently: 10 mg.) 15 tablet 0   Glucosamine-Chondroit-Vit C-Mn (GLUCOSAMINE CHONDR 1500 COMPLX PO) 1 capsule     Multiple Vitamin (MULTIVITAMIN ADULT PO) multivitamin     Omega-3 Fatty Acids (FISH OIL) 1000 MG CAPS Fish Oil     tadalafil (CIALIS) 5 MG tablet Take 5 mg by mouth daily.     tamsulosin (FLOMAX) 0.4 MG CAPS capsule Take 1 capsule (0.4 mg total) by mouth daily after supper. 30 capsule 5   TURMERIC PO turmeric     vitamin E 45 MG (100 UNITS) capsule 1 capsule     No current facility-administered medications for this encounter.    Physical Findings:  vitals were not taken for this visit.  Pain Assessment Pain Score: 0-No pain/10 Unable to assess due to telephone follow-up visit format.  Lab Findings: Lab Results  Component Value Date   WBC 4.6 06/28/2011   HGB 15.1 06/28/2011   HCT 44.4 06/28/2011   MCV 95.4 06/28/2011   PLT 235.0 06/28/2011     Radiographic Findings: No results found.  Impression/Plan: 1. 80 y.o. gentleman with Stage T1c adenocarcinoma of the prostate with Gleason score of 3+4, and PSA of 7.2.   He will continue to follow up with urology for ongoing PSA determinations and  has an appointment scheduled for labs on 04/26/21 and will see Dr. Diona Fanti on 05/03/21. He understands what to expect with regards to PSA monitoring going forward. He will continue taking Flomax daily as prescribed. I will look forward to following his response to treatment via correspondence with urology, and would be happy to continue to participate in his care if clinically indicated. I talked to the patient about what to expect in the future, including his risk for erectile dysfunction and rectal bleeding. I encouraged him to call or return to the office if he has any questions regarding his previous  radiation or possible radiation side effects. He was comfortable with this plan and will follow up as needed.     Nicholos Johns, PA-C

## 2021-03-16 DIAGNOSIS — Z23 Encounter for immunization: Secondary | ICD-10-CM | POA: Diagnosis not present

## 2021-04-27 DIAGNOSIS — M353 Polymyalgia rheumatica: Secondary | ICD-10-CM | POA: Diagnosis not present

## 2021-04-27 DIAGNOSIS — E782 Mixed hyperlipidemia: Secondary | ICD-10-CM | POA: Diagnosis not present

## 2021-04-27 DIAGNOSIS — N529 Male erectile dysfunction, unspecified: Secondary | ICD-10-CM | POA: Diagnosis not present

## 2021-04-27 DIAGNOSIS — C61 Malignant neoplasm of prostate: Secondary | ICD-10-CM | POA: Diagnosis not present

## 2021-04-27 DIAGNOSIS — Z79899 Other long term (current) drug therapy: Secondary | ICD-10-CM | POA: Diagnosis not present

## 2021-04-27 DIAGNOSIS — Z1211 Encounter for screening for malignant neoplasm of colon: Secondary | ICD-10-CM | POA: Diagnosis not present

## 2021-04-27 DIAGNOSIS — E559 Vitamin D deficiency, unspecified: Secondary | ICD-10-CM | POA: Diagnosis not present

## 2021-04-27 DIAGNOSIS — M1000 Idiopathic gout, unspecified site: Secondary | ICD-10-CM | POA: Diagnosis not present

## 2021-04-27 DIAGNOSIS — Z0000 Encounter for general adult medical examination without abnormal findings: Secondary | ICD-10-CM | POA: Diagnosis not present

## 2021-04-27 DIAGNOSIS — M129 Arthropathy, unspecified: Secondary | ICD-10-CM | POA: Diagnosis not present

## 2021-04-27 DIAGNOSIS — Z1389 Encounter for screening for other disorder: Secondary | ICD-10-CM | POA: Diagnosis not present

## 2021-05-03 DIAGNOSIS — N401 Enlarged prostate with lower urinary tract symptoms: Secondary | ICD-10-CM | POA: Diagnosis not present

## 2021-05-03 DIAGNOSIS — N5201 Erectile dysfunction due to arterial insufficiency: Secondary | ICD-10-CM | POA: Diagnosis not present

## 2021-05-03 DIAGNOSIS — R351 Nocturia: Secondary | ICD-10-CM | POA: Diagnosis not present

## 2021-05-03 DIAGNOSIS — C61 Malignant neoplasm of prostate: Secondary | ICD-10-CM | POA: Diagnosis not present

## 2021-06-03 DIAGNOSIS — M545 Low back pain, unspecified: Secondary | ICD-10-CM | POA: Diagnosis not present

## 2021-06-08 DIAGNOSIS — M545 Low back pain, unspecified: Secondary | ICD-10-CM | POA: Diagnosis not present

## 2021-06-18 ENCOUNTER — Telehealth: Payer: Self-pay | Admitting: Physical Medicine and Rehabilitation

## 2021-06-18 NOTE — Telephone Encounter (Signed)
Pt stated he spoke with Dr. Ernestina Patches and was told to give [place] a call to get sch'd when possible. The best call back number is [phone].

## 2021-06-22 ENCOUNTER — Encounter: Payer: Self-pay | Admitting: Orthopedic Surgery

## 2021-06-22 ENCOUNTER — Other Ambulatory Visit: Payer: Self-pay

## 2021-06-22 ENCOUNTER — Ambulatory Visit (INDEPENDENT_AMBULATORY_CARE_PROVIDER_SITE_OTHER): Payer: Medicare Other | Admitting: Orthopedic Surgery

## 2021-06-22 DIAGNOSIS — M545 Low back pain, unspecified: Secondary | ICD-10-CM

## 2021-06-22 DIAGNOSIS — G8929 Other chronic pain: Secondary | ICD-10-CM

## 2021-06-22 DIAGNOSIS — M48061 Spinal stenosis, lumbar region without neurogenic claudication: Secondary | ICD-10-CM

## 2021-06-22 NOTE — Progress Notes (Signed)
Office Visit Note   Patient: Travis Hendricks           Date of Birth: Aug 23, 1940           MRN: [id number] Visit Date: 06/22/2021              Requested by: [name], MD [address] [place],  [place] [address] PCP: [name], MD  Chief Complaint  Patient presents with   Lower Back - Pain      HPI: Patient is an 81 year old gentleman who presents with complaints of midline lower back pain.  Patient denies any radicular symptoms denies any weakness.  Patient states that he started taking a prednisone Dosepak he is currently on 20 mg a day and states that he has very little relief of his symptoms.  Assessment & Plan: Visit Diagnoses:  1. Chronic midline low back pain without sciatica     Plan: Patient's last MRI scan was February 2021.  We will request a MRI scan to further identify his symptoms.  Patient has an appointment with Dr. Ernestina Patches on the sixth.  Follow-Up Instructions: Return if symptoms worsen or fail to improve.   Ortho Exam  Patient is alert, oriented, no adenopathy, well-dressed, normal affect, normal respiratory effort. Examination patient has a normal gait he has a negative straight leg raise bilaterally no pain with range of motion of the hip knees or ankles bilaterally.  Hip flexion plantar flexion and dorsiflexion is strong without weakness.  Review of his previous MRI scan shows stenosis at L2-3.  Patient's current symptoms may be more consistent with facet arthropathy at this level.  Imaging neuropathy at this level.: No results found. No images are attached to the encounter.  Labs: Lab Results  Component Value Date   HGBA1C 5.7 06/28/2011   HGBA1C 5.9 06/24/2008     Lab Results  Component Value Date   ALBUMIN 3.8 06/28/2011   ALBUMIN 3.6 06/18/2010   ALBUMIN 3.7 06/01/2009    No results found for: MG No results found for: VD25OH  No results found for: PREALBUMIN CBC EXTENDED Latest Ref Rng & Units 06/28/2011 06/18/2010  06/01/2009  WBC 4.5 - 10.5 K/uL 4.6 5.8 5.6  RBC 4.22 - 5.81 Mil/uL 4.65 4.82 4.79  HGB 13.0 - 17.0 g/dL 15.1 15.4 15.4  HCT 39.0 - 52.0 % 44.4 45.6 45.6  PLT 150.0 - 400.0 K/uL 235.0 236.0 239.0  NEUTROABS 1.4 - 7.7 K/uL 2.0 2.4 2.5  LYMPHSABS 0.7 - 4.0 K/uL 1.9 2.5 2.1     There is no height or weight on file to calculate BMI.  Orders:  No orders of the defined types were placed in this encounter.  No orders of the defined types were placed in this encounter.    Procedures: No procedures performed  Clinical Data: No additional findings.  ROS:  All other systems negative, except as noted in the HPI. Review of Systems  Objective: Vital Signs: There were no vitals taken for this visit.  Specialty Comments:  No specialty comments available.  PMFS History: Patient Active Problem List   Diagnosis Date Noted   Chronic kidney disease, stage 3 unspecified ([place]) 09/14/2020   Cough 09/14/2020   Diarrhea 09/14/2020   ED (erectile dysfunction) of organic origin 09/14/2020   Elevated blood-pressure reading without diagnosis of hypertension 09/14/2020   Elevated PSA 09/14/2020   Encounter for general adult medical examination without abnormal findings 09/14/2020   Encounter for screening for other disorder 09/14/2020  Encounter for immunization 09/14/2020   Encounter for general adult medical examination with abnormal findings 09/14/2020   Infectious colitis, enteritis and gastroenteritis 09/14/2020   Left lower quadrant pain 09/14/2020   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/14/2020   Other long term (current) drug therapy 73/42/8768   Periumbilical abdominal pain 09/14/2020   Polymyalgia rheumatica ([place]) 09/14/2020   Primary gout 09/14/2020   Malignant neoplasm of prostate ([place]) 09/14/2020   Lumbar radiculopathy 09/14/2020   Vitamin D deficiency 09/14/2020   Weight loss 09/14/2020   Body mass index (BMI) 25.0-25.9, adult 01/17/2020   Essential (primary)  hypertension 01/17/2020   Other chronic pain 01/17/2020   Spondylosis 01/17/2020   Osteoarthritis of left hip 11/29/2017   Osteoarthritis of right hip 11/29/2017   Basal cell carcinoma 11/25/2015   VENOUS INSUFFICIENCY, MILD 12/23/2008   DIVERTICULOSIS, COLON 06/09/2008   LOW BACK PAIN, CHRONIC 06/09/2008   HYPERLIPIDEMIA 07/21/2006   GILBERT'S SYNDROME 07/21/2006   Past Medical History:  Diagnosis Date   Basal cell carcinoma    Diverticulosis    Other and unspecified hyperlipidemia    Prostate cancer ([place])     Family History  Problem Relation Age of Onset   Heart attack Father    Lung cancer Mother    Prostate cancer Brother    Colon cancer Neg Hx    Pancreatic cancer Neg Hx     Past Surgical History:  Procedure Laterality Date   CATARACT EXTRACTION W/ INTRAOCULAR LENS IMPLANT     bilaterally   COLONOSCOPY     Tics   ELBOW SURGERY     torn ligament   HERNIA REPAIR     KNEE ARTHROSCOPY     VASECTOMY     Social History   Occupational History    Comment: retired  Tobacco Use   Smoking status: Never   Smokeless tobacco: Never  Vaping Use   Vaping Use: Never used  Substance and Sexual Activity   Alcohol use: Yes    Alcohol/week: 14.0 standard drinks    Types: 7 Glasses of wine, 7 Shots of liquor per week   Drug use: No   Sexual activity: Yes

## 2021-06-25 ENCOUNTER — Encounter: Payer: Self-pay | Admitting: Physical Medicine and Rehabilitation

## 2021-06-25 ENCOUNTER — Ambulatory Visit (INDEPENDENT_AMBULATORY_CARE_PROVIDER_SITE_OTHER): Payer: Medicare Other | Admitting: Physical Medicine and Rehabilitation

## 2021-06-25 ENCOUNTER — Other Ambulatory Visit: Payer: Self-pay

## 2021-06-25 VITALS — BP 191/99 | HR 61

## 2021-06-25 DIAGNOSIS — G8929 Other chronic pain: Secondary | ICD-10-CM | POA: Diagnosis not present

## 2021-06-25 DIAGNOSIS — M47819 Spondylosis without myelopathy or radiculopathy, site unspecified: Secondary | ICD-10-CM

## 2021-06-25 DIAGNOSIS — M47816 Spondylosis without myelopathy or radiculopathy, lumbar region: Secondary | ICD-10-CM | POA: Diagnosis not present

## 2021-06-25 DIAGNOSIS — M48061 Spinal stenosis, lumbar region without neurogenic claudication: Secondary | ICD-10-CM

## 2021-06-25 DIAGNOSIS — M545 Low back pain, unspecified: Secondary | ICD-10-CM | POA: Diagnosis not present

## 2021-06-25 MED ORDER — TRAMADOL HCL 50 MG PO TABS: 50.0000 mg | ORAL_TABLET | Freq: Three times a day (TID) | ORAL | 0 refills | Status: AC | PRN

## 2021-06-25 NOTE — Progress Notes (Signed)
Pt state lower back pain that travels to his right hip. Pt state sitting makes the pain worse. Pt state he takes pain meds to help ease his pain.  Numeric Pain Rating Scale and Functional Assessment Average Pain 9 Pain Right Now 7 My pain is constant, sharp, stabbing, and aching Pain is worse with: sitting Pain improves with: medication   In the last MONTH (on 0-10 scale) has pain interfered with the following?  1. General activity like being  able to carry out your everyday physical activities such as walking, climbing stairs, carrying groceries, or moving a chair?  Rating(7)  2. Relation with others like being able to carry out your usual social activities and roles such as  activities at home, at work and in your community. Rating(8)  3. Enjoyment of life such that you have  been bothered by emotional problems such as feeling anxious, depressed or irritable?  Rating(9)

## 2021-06-25 NOTE — Progress Notes (Signed)
Travis Hendricks - 81 y.o. male MRN [id number]  Date of birth: 1940-11-26  Office Visit Note: Visit Date: 06/25/2021 PCP: [name], MD Referred by: [name], MD  Subjective: Chief Complaint  Patient presents with   Lower Back - Pain   Right Hip - Pain   HPI: Travis Hendricks is a 81 y.o. male who comes in today for evaluation of chronic, worsening and severe right sided lower back pain radiating to hip. Patient reports pain has been ongoing for several months after "twisting" his back doing yard work. Patient reports pain is exacerbated by prolonged standing and when moving from sitting to standing position. Patient describes pain as a constant sore sensation, currently rates as 9 out of 10. Patient reports some relief of pain with home exercise regimen, rest and use of medications. Patient states he recently finished a short course of Prednisone that did help to alleviate pain. Patient has history of microdiscectomy at L4-L5 several years ago by Dr. Latanya Maudlin. Patient had multiple lumbar transforaminal epidural steroid injections in 2021 by Dr. Magnus Sinning which he reports helped significantly to alleviate his pain. Patient's lumbar MRI from 2021 exhibits multi-level facet hypertrophy, mild to moderate spinal stenosis with asymmetric right subarticular recess narrowing where there is a 6 mm synovial cyst at L2-L3, and widely patent canal after laminectomy at L4-L5. No high grade spinal canal stenosis noted.  Patient was recently seen by Dr. Meridee Score who ordered new lumbar MRI imaging, however patient has not been scheduled for this procedure. Patient states he is a very active person and has a ski tripped planned in [place] next week. Patient denies focal weakness, numbness and tingling. Patient denies recent trauma or falls.   Review of Systems  Musculoskeletal:  Positive for back pain.  Neurological:  Negative for tingling, sensory change, focal weakness and weakness.   All other systems reviewed and are negative. Otherwise per HPI.  Assessment & Plan: Visit Diagnoses:    ICD-10-CM   1. Spondylosis without myelopathy or radiculopathy  M47.819     2. Chronic right-sided low back pain without sciatica  M54.50 Ambulatory referral to Physical Medicine Rehab   G89.29     3. Facet hypertrophy of lumbar region  M47.816     4. Stenosis of lateral recess of lumbar spine  M48.061        Plan: Findings:  Chronic, worsening and severe right-sided lower back pain radiating to hip.  Patient continues to have excruciating pain despite good conservative therapy such as home exercise regimen, rest and use of medications.  Patient's clinical presentation and exam are consistent with facet mediated pain.  Patient's lumbar MRI from 2021 does exhibit posterior element hypertrophy, worse at L3-L4 and L4-L5.  We believe the next step is to perform a diagnostic and hopefully therapeutic right L3-L4 and L4-L5 facet joint/medial branch blocks under fluoroscopic guidance.  If patient does well with facet joint injections we did discuss the possibility of longer sustained relief with radiofrequency ablation.  We also talked with patient today about pain management and did prescribe a short course of Tramadol to take until he is able to come in for injection.  Patient states that he has a ski trip planned next week in [place] and would like to have his injection before he leaves, we will do our best to accommodate this.  Patient's new lumbar MRI imaging is ordered by Dr. Sharol Given, patient instructed to let us know when he is  scheduled for this procedure so that we can look for results and have him follow back up in the office for review.  Patient encouraged to remain active and to continue home exercise regimen as tolerated.  No red flag symptoms noted upon exam today.   Meds & Orders:  Meds ordered this encounter  Medications   traMADol (ULTRAM) 50 MG tablet    Sig: Take 1 tablet (50 mg  total) by mouth every 8 (eight) hours as needed for moderate pain or severe pain.    Dispense:  25 tablet    Refill:  0    Order Specific Question:   Supervising Provider    Answer:   Magnus Sinning [[id number]]    Orders Placed This Encounter  Procedures   Ambulatory referral to Physical Medicine Rehab    Follow-up: Return for Right L3-L4 and L4-L5 facet joint/medial branch blocks.   Procedures: No procedures performed      Clinical History: MRI LUMBAR SPINE WITHOUT CONTRAST   TECHNIQUE: Multiplanar, multisequence MR imaging of the lumbar spine was performed. No intravenous contrast was administered.   COMPARISON:  01/09/2009   FINDINGS: Segmentation:  Standard lumbar numbering   Alignment:  Normal   Vertebrae: No evidence of fracture, discitis, or aggressive bone lesion   Conus medullaris and cauda equina: Conus extends to the L1 level. Conus and cauda equina appear normal.   Paraspinal and other soft tissues: Negative   Disc levels:   T12- L1: Unremarkable.   L1-L2: Disc bulging with posterior annular fissure. Negative facets. No impingement   L2-L3: Disc narrowing and bulging and posterior element hypertrophy. Bilateral subarticular recess stenosis, greater on the right where there is a 6 mm flat synovial cyst. The foramina are patent   L3-L4: Posterior element hypertrophy. Disc narrowing and bulging. Bilateral subarticular recess narrowing without static L4 compression. The foramina are patent   L4-L5: Disc narrowing with T1 and T2 hyperintensity that is likely from ankylosis. There is endplate and facet spurring. Prior laminectomy with patent spinal canal. Patent foramina   L5-S1:Disc narrowing and ridging. Minor facet spurring. No impingement.   IMPRESSION: 1. 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 2. L2-3 mild to moderate spinal stenosis with asymmetric right subarticular recess narrowing where there is a 6 mm synovial  cyst. 3. L3-4 mild spinal and bilateral subarticular recess stenosis. 4. L4-5 widely patent canal after laminectomy. 5. Diffusely patent foramina.     Electronically Signed   By: Monte Fantasia M.D.   On: 08/08/2019 04:17   He reports that he has never smoked. He has never used smokeless tobacco. No results for input(s): HGBA1C, LABURIC in the last 8760 hours.  Objective:  VS:  HT:     WT:    BMI:      BP:(!) 191/99   HR:61bpm   TEMP: ( )   RESP:  Physical Exam Vitals and nursing note reviewed.  HENT:     Head: Normocephalic and atraumatic.     Right Ear: External ear normal.     Left Ear: External ear normal.     Nose: Nose normal.     Mouth/Throat:     Mouth: Mucous membranes are moist.  Eyes:     Extraocular Movements: Extraocular movements intact.  Cardiovascular:     Rate and Rhythm: Normal rate.     Pulses: Normal pulses.  Pulmonary:     Effort: Pulmonary effort is normal.  Abdominal:     General: Abdomen is flat. There  is no distension.  Musculoskeletal:        General: Tenderness present.     Cervical back: Normal range of motion.     Comments: Pt is slow to rise from seated position to standing. Concordant low back pain with facet loading, lumbar spine extension and rotation. Strong distal strength without clonus, no pain upon palpation of greater trochanters. Sensation intact bilaterally. Walks independently, gait steady.   Skin:    General: Skin is warm and dry.     Capillary Refill: Capillary refill takes less than 2 seconds.  Neurological:     General: No focal deficit present.     Mental Status: He is alert and oriented to person, place, and time.  Psychiatric:        Mood and Affect: Mood normal.    Ortho Exam  Imaging: No results found.  Past Medical/Family/Surgical/Social History: Medications & Allergies reviewed per EMR, new medications updated. Patient Active Problem List   Diagnosis Date Noted   Chronic kidney disease, stage 3 unspecified  ([place]) 09/14/2020   Cough 09/14/2020   Diarrhea 09/14/2020   ED (erectile dysfunction) of organic origin 09/14/2020   Elevated blood-pressure reading without diagnosis of hypertension 09/14/2020   Elevated PSA 09/14/2020   Encounter for general adult medical examination without abnormal findings 09/14/2020   Encounter for screening for other disorder 09/14/2020   Encounter for immunization 09/14/2020   Encounter for general adult medical examination with abnormal findings 09/14/2020   Infectious colitis, enteritis and gastroenteritis 09/14/2020   Left lower quadrant pain 09/14/2020   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/14/2020   Other long term (current) drug therapy 47/42/5956   Periumbilical abdominal pain 09/14/2020   Polymyalgia rheumatica ([place]) 09/14/2020   Primary gout 09/14/2020   Malignant neoplasm of prostate ([place]) 09/14/2020   Lumbar radiculopathy 09/14/2020   Vitamin D deficiency 09/14/2020   Weight loss 09/14/2020   Body mass index (BMI) 25.0-25.9, adult 01/17/2020   Essential (primary) hypertension 01/17/2020   Other chronic pain 01/17/2020   Spondylosis 01/17/2020   Osteoarthritis of left hip 11/29/2017   Osteoarthritis of right hip 11/29/2017   Basal cell carcinoma 11/25/2015   VENOUS INSUFFICIENCY, MILD 12/23/2008   DIVERTICULOSIS, COLON 06/09/2008   LOW BACK PAIN, CHRONIC 06/09/2008   HYPERLIPIDEMIA 07/21/2006   GILBERT'S SYNDROME 07/21/2006   Past Medical History:  Diagnosis Date   Basal cell carcinoma    Diverticulosis    Other and unspecified hyperlipidemia    Prostate cancer ([place])    Family History  Problem Relation Age of Onset   Heart attack Father    Lung cancer Mother    Prostate cancer Brother    Colon cancer Neg Hx    Pancreatic cancer Neg Hx    Past Surgical History:  Procedure Laterality Date   CATARACT EXTRACTION W/ INTRAOCULAR LENS IMPLANT     bilaterally   COLONOSCOPY     Tics   ELBOW SURGERY     torn ligament   HERNIA  REPAIR     KNEE ARTHROSCOPY     VASECTOMY     Social History   Occupational History    Comment: retired  Tobacco Use   Smoking status: Never   Smokeless tobacco: Never  Vaping Use   Vaping Use: Never used  Substance and Sexual Activity   Alcohol use: Yes    Alcohol/week: 14.0 standard drinks    Types: 7 Glasses of wine, 7 Shots of liquor per week   Drug use: No  Sexual activity: Yes

## 2021-06-28 ENCOUNTER — Telehealth: Payer: Self-pay | Admitting: Physical Medicine and Rehabilitation

## 2021-06-28 NOTE — Telephone Encounter (Signed)
Pt called to let megan know that the pain in going around the buttocks and down the right leg. Pain makes him feel like he can barley walk.   CB [phone]

## 2021-07-01 ENCOUNTER — Ambulatory Visit (INDEPENDENT_AMBULATORY_CARE_PROVIDER_SITE_OTHER): Payer: Medicare Other | Admitting: Physical Medicine and Rehabilitation

## 2021-07-01 ENCOUNTER — Other Ambulatory Visit: Payer: Self-pay

## 2021-07-01 ENCOUNTER — Encounter: Payer: Self-pay | Admitting: Physical Medicine and Rehabilitation

## 2021-07-01 ENCOUNTER — Ambulatory Visit: Payer: Self-pay

## 2021-07-01 DIAGNOSIS — M5416 Radiculopathy, lumbar region: Secondary | ICD-10-CM

## 2021-07-01 DIAGNOSIS — M47816 Spondylosis without myelopathy or radiculopathy, lumbar region: Secondary | ICD-10-CM

## 2021-07-01 MED ORDER — METHYLPREDNISOLONE ACETATE 80 MG/ML IJ SUSP: 80.0000 mg | Freq: Once | INTRAMUSCULAR | Status: DC

## 2021-07-01 NOTE — Patient Instructions (Signed)

## 2021-07-01 NOTE — Progress Notes (Signed)
Pt state lower back pain that travels to his buttock, down to his right hip and thigh.. Pt state sitting makes the pain worse. Pt state he takes pain meds to help ease his pain.   Numeric Pain Rating Scale and Functional Assessment Average Pain 9   In the last MONTH (on 0-10 scale) has pain interfered with the following?  1. General activity like being  able to carry out your everyday physical activities such as walking, climbing stairs, carrying groceries, or moving a chair?  Rating(10)   +Driver, -BT, -Dye Allergies.

## 2021-07-02 ENCOUNTER — Telehealth: Payer: Self-pay

## 2021-07-02 NOTE — Telephone Encounter (Signed)
Per Dr. Sharol Given this pt is having his scan on 07/08/21 and would like for him to follow up with Fn that afternoon or the next day. Is there any way that you can sch this appt for this pt. Thanks!

## 2021-07-06 ENCOUNTER — Telehealth: Payer: Self-pay | Admitting: Physical Medicine and Rehabilitation

## 2021-07-06 NOTE — Telephone Encounter (Signed)
Pt called requesting a call to schedule an appt. Please call pt at [phone].

## 2021-07-07 ENCOUNTER — Ambulatory Visit: Payer: Medicare Other | Admitting: Physical Medicine and Rehabilitation

## 2021-07-08 ENCOUNTER — Other Ambulatory Visit: Payer: Self-pay

## 2021-07-08 ENCOUNTER — Ambulatory Visit
Admission: RE | Admit: 2021-07-08 | Discharge: 2021-07-08 | Disposition: A | Discharge status: Home / Self Care | Payer: Medicare Other | Source: Ambulatory Visit | Attending: Orthopedic Surgery | Admitting: Orthopedic Surgery

## 2021-07-08 DIAGNOSIS — M48061 Spinal stenosis, lumbar region without neurogenic claudication: Secondary | ICD-10-CM

## 2021-07-08 DIAGNOSIS — G8929 Other chronic pain: Secondary | ICD-10-CM

## 2021-07-09 ENCOUNTER — Ambulatory Visit (INDEPENDENT_AMBULATORY_CARE_PROVIDER_SITE_OTHER): Payer: Medicare Other | Admitting: Physical Medicine and Rehabilitation

## 2021-07-09 ENCOUNTER — Encounter: Payer: Self-pay | Admitting: Physical Medicine and Rehabilitation

## 2021-07-09 VITALS — BP 163/87 | HR 55

## 2021-07-09 DIAGNOSIS — M47816 Spondylosis without myelopathy or radiculopathy, lumbar region: Secondary | ICD-10-CM

## 2021-07-09 DIAGNOSIS — M5116 Intervertebral disc disorders with radiculopathy, lumbar region: Secondary | ICD-10-CM | POA: Diagnosis not present

## 2021-07-09 DIAGNOSIS — M5416 Radiculopathy, lumbar region: Secondary | ICD-10-CM

## 2021-07-09 DIAGNOSIS — M48062 Spinal stenosis, lumbar region with neurogenic claudication: Secondary | ICD-10-CM | POA: Diagnosis not present

## 2021-07-09 NOTE — Progress Notes (Signed)
Pt has hx of inj on 07/01/21 pt state it started helping on the third day with 50% relief. Pt state he has to walk very carefully and watch how he bending and walk because the pain he still can feel.  Numeric Pain Rating Scale and Functional Assessment Average Pain 8 Pain Right Now 3 My pain is intermittent, stabbing, and aching Pain is worse with: walking, bending, and some activites Pain improves with: medication and injections   In the last MONTH (on 0-10 scale) has pain interfered with the following?  1. General activity like being  able to carry out your everyday physical activities such as walking, climbing stairs, carrying groceries, or moving a chair?  Rating(5)  2. Relation with others like being able to carry out your usual social activities and roles such as  activities at home, at work and in your community. Rating(6)  3. Enjoyment of life such that you have  been bothered by emotional problems such as feeling anxious, depressed or irritable?  Rating(7)

## 2021-07-09 NOTE — Progress Notes (Signed)
Travis Hendricks - 81 y.o. male MRN [id number]  Date of birth: 10/29/1940  Office Visit Note: Visit Date: 07/09/2021 PCP: Josetta Huddle, MD Referred by: Josetta Huddle, MD  Subjective: Chief Complaint  Patient presents with   Lower Back - Pain   HPI: Travis Hendricks is a 81 y.o. male who comes in today for evaluation of chronic and severe right sided lower back pain radiating to buttock, hip and anterior thigh. Patient had right L3 transforaminal epidural steroid injection on 07/01/2021 and reports greater than 50% relief of pain. Patient reports pain is exacerbated by prolonged standing/walking and activity, describes pain as dull and sore, currently rates as 8 out of 10. Patient reports some relief of pain with home exercise regimen, rest and use of medications. Patients recent lumbar MRI exhibits multi-level lumbar spondylosis that has progressed at the levels of L2-L3 and L3-L4 since 2021. At L2-L3 there is a new central disc herniation with 11 mm of cranial extension resulting in moderate spinal canal stenosis. There are prior laminectomy changes at L4-L5, no spinal canal stenosis at this level. Patient has history of microdiscectomy at L4-L5 several years ago by Dr. Latanya Maudlin. Patient states he does feel much better after recent epidural steroid injection and is not currently using cane to assist with ambulation. Patient denies focal weakness, numbness and tingling. Patient denies recent trauma or falls.   Review of Systems  Musculoskeletal:  Positive for back pain.  Neurological:  Negative for tingling, sensory change, focal weakness and weakness.  All other systems reviewed and are negative. Otherwise per HPI.  Assessment & Plan: Visit Diagnoses:    ICD-10-CM   1. Lumbar radiculopathy  M54.16 Ambulatory referral to Physical Medicine Rehab    2. Intervertebral disc disorders with radiculopathy, lumbar region  M51.16 Ambulatory referral to Physical Medicine Rehab    3. Spinal  stenosis of lumbar region with neurogenic claudication  M48.062 Ambulatory referral to Physical Medicine Rehab    4. Facet hypertrophy of lumbar region  M47.816        Plan: Findings:  Chronic and severe right sided lower back pain radiating to buttock, hip and anterior thigh. Recent right L3 transforaminal epidural steroid injection provided greater than 50% pain relief. Patient is feeling much better, but does continue to have pain. He is continuing home exercises and use of medications as needed. Patients clinical presentation and exam are consistent with L3 nerve pattern. We did review patients lumbar MRI with him in detail today using images and spine model. There is progression of multi-level spondylosis. There is also new disc herniation and moderate spinal canal stenosis at L2-L3 in comparison to MRI from 2021. We believe the next step is to repeat right L3 transforaminal epidural steroid injection under fluoroscopic guidance. We feel that we can get patient in quickly for injection. Patient encouraged to remain active and to continue home exercises as tolerated. No red flag symptoms noted upon exam today.    Meds & Orders: No orders of the defined types were placed in this encounter.   Orders Placed This Encounter  Procedures   Ambulatory referral to Physical Medicine Rehab    Follow-up: Return for Right L3 transforaminal epidural steroid injection.   Procedures: No procedures performed      Clinical History: EXAM: MRI LUMBAR SPINE WITHOUT CONTRAST   TECHNIQUE: Multiplanar, multisequence MR imaging of the lumbar spine was performed. No intravenous contrast was administered.   COMPARISON:  MRI 08/07/2019   FINDINGS: Segmentation:  Standard.   Alignment:  Physiologic.   Vertebrae: No fracture, evidence of discitis, or suspicious bone lesion. Scattered benign intraosseous hemangiomas. Chronic discogenic endplate marrow changes of L4-5.   Conus medullaris and cauda equina:  Conus extends to the L1 level. Conus and cauda equina appear normal.   Paraspinal and other soft tissues: Negative.   Disc levels:   T12-L1: Unremarkable.   L1-L2: Minimal disc bulge with posterior annular fissure. Facet joints within normal limits. Borderline-mild right foraminal stenosis. No canal stenosis. Unchanged.   L2-L3: Broad-based disc bulge with superimposed new central disc herniation with 11 mm of cranial extension. Mild bilateral facet arthropathy and ligamentum flavum buckling. Findings contribute to moderate canal stenosis. No foraminal stenosis. Findings have worsened from prior.   L3-L4: Broad-based disc bulge with right greater than left biforaminal protrusions, slightly progressed. Bilateral facet arthropathy and ligamentum flavum buckling. Findings result in moderate right and mild left subarticular recess stenosis without significant canal stenosis. Borderline-mild bilateral foraminal stenosis. Findings progressed from prior.   L4-L5: Prior laminectomy changes. Chronic disc height loss with mild endplate ridging. Mild bilateral facet arthropathy. Mild-moderate left and mild right foraminal stenosis. No canal stenosis. Unchanged.   L5-S1: Disc height loss with mild endplate ridging. Mild bilateral facet arthropathy. Mild right-sided foraminal stenosis. No canal stenosis. Unchanged.   IMPRESSION: 1. 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 2. 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 3. Mild-moderate left and mild right foraminal stenosis at L4-5.     Electronically Signed   By: Davina Poke D.O.   On: 07/08/2021 11:02   He reports that he has never smoked. He has never used smokeless tobacco. No results for input(s): HGBA1C, LABURIC in the last 8760 hours.  Objective:  VS:  HT:     WT:    BMI:      BP:(!) 163/87   HR:(!) 55bpm   TEMP:  ( )   RESP:  Physical Exam Vitals reviewed.  HENT:     Head: Normocephalic and atraumatic.     Right Ear: External ear normal.     Left Ear: External ear normal.     Nose: Nose normal.     Mouth/Throat:     Mouth: Mucous membranes are moist.  Eyes:     Extraocular Movements: Extraocular movements intact.  Cardiovascular:     Rate and Rhythm: Normal rate.     Pulses: Normal pulses.  Pulmonary:     Effort: Pulmonary effort is normal.  Abdominal:     General: Abdomen is flat. There is no distension.  Musculoskeletal:        General: Tenderness present.     Cervical back: Normal range of motion.     Comments: Pt is slow to rise from seated position to standing. Good lumbar range of motion. Strong distal strength without clonus, no pain upon palpation of greater trochanters. Sensation intact bilaterally. Dysesthesias noted to right L3 dermatome. Walks independently, gait steady.   Skin:    General: Skin is warm and dry.     Capillary Refill: Capillary refill takes less than 2 seconds.  Neurological:     General: No focal deficit present.     Mental Status: He is alert and oriented to person, place, and time.  Psychiatric:        Mood and Affect: Mood normal.        Behavior: Behavior normal.  Ortho Exam  Imaging: No results found.  Past Medical/Family/Surgical/Social History: Medications & Allergies reviewed per EMR, new medications updated. Patient Active Problem List   Diagnosis Date Noted   Chronic kidney disease, stage 3 unspecified ([place]) 09/14/2020   Cough 09/14/2020   Diarrhea 09/14/2020   ED (erectile dysfunction) of organic origin 09/14/2020   Elevated blood-pressure reading without diagnosis of hypertension 09/14/2020   Elevated PSA 09/14/2020   Encounter for general adult medical examination without abnormal findings 09/14/2020   Encounter for screening for other disorder 09/14/2020   Encounter for immunization 09/14/2020   Encounter for general adult medical  examination with abnormal findings 09/14/2020   Infectious colitis, enteritis and gastroenteritis 09/14/2020   Left lower quadrant pain 09/14/2020   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/14/2020   Other long term (current) drug therapy 76/72/0947   Periumbilical abdominal pain 09/14/2020   Polymyalgia rheumatica ([place]) 09/14/2020   Primary gout 09/14/2020   Malignant neoplasm of prostate ([place]) 09/14/2020   Lumbar radiculopathy 09/14/2020   Vitamin D deficiency 09/14/2020   Weight loss 09/14/2020   Body mass index (BMI) 25.0-25.9, adult 01/17/2020   Essential (primary) hypertension 01/17/2020   Other chronic pain 01/17/2020   Spondylosis 01/17/2020   Osteoarthritis of left hip 11/29/2017   Osteoarthritis of right hip 11/29/2017   Basal cell carcinoma 11/25/2015   VENOUS INSUFFICIENCY, MILD 12/23/2008   DIVERTICULOSIS, COLON 06/09/2008   LOW BACK PAIN, CHRONIC 06/09/2008   HYPERLIPIDEMIA 07/21/2006   GILBERT'S SYNDROME 07/21/2006   Past Medical History:  Diagnosis Date   Basal cell carcinoma    Diverticulosis    Other and unspecified hyperlipidemia    Prostate cancer ([place])    Family History  Problem Relation Age of Onset   Heart attack Father    Lung cancer Mother    Prostate cancer Brother    Colon cancer Neg Hx    Pancreatic cancer Neg Hx    Past Surgical History:  Procedure Laterality Date   CATARACT EXTRACTION W/ INTRAOCULAR LENS IMPLANT     bilaterally   COLONOSCOPY     Tics   ELBOW SURGERY     torn ligament   HERNIA REPAIR     KNEE ARTHROSCOPY     VASECTOMY     Social History   Occupational History    Comment: retired  Tobacco Use   Smoking status: Never   Smokeless tobacco: Never  Vaping Use   Vaping Use: Never used  Substance and Sexual Activity   Alcohol use: Yes    Alcohol/week: 14.0 standard drinks    Types: 7 Glasses of wine, 7 Shots of liquor per week   Drug use: No   Sexual activity: Yes

## 2021-07-13 ENCOUNTER — Ambulatory Visit: Payer: Self-pay

## 2021-07-13 ENCOUNTER — Encounter: Payer: Self-pay | Admitting: Physical Medicine and Rehabilitation

## 2021-07-13 ENCOUNTER — Other Ambulatory Visit: Payer: Self-pay

## 2021-07-13 ENCOUNTER — Ambulatory Visit (INDEPENDENT_AMBULATORY_CARE_PROVIDER_SITE_OTHER): Payer: Medicare Other | Admitting: Physical Medicine and Rehabilitation

## 2021-07-13 VITALS — BP 146/95 | HR 92

## 2021-07-13 DIAGNOSIS — M5116 Intervertebral disc disorders with radiculopathy, lumbar region: Secondary | ICD-10-CM

## 2021-07-13 DIAGNOSIS — M5416 Radiculopathy, lumbar region: Secondary | ICD-10-CM | POA: Diagnosis not present

## 2021-07-13 MED ORDER — METHYLPREDNISOLONE ACETATE 80 MG/ML IJ SUSP
80.0000 mg | Freq: Once | INTRAMUSCULAR | Status: AC
  Administered 2021-07-13: 08:00:00 80 mg

## 2021-07-13 NOTE — Patient Instructions (Signed)

## 2021-07-13 NOTE — Progress Notes (Signed)
Pt state lower back pain. Pt state walking and standing makes the pain worse. Pt state he take [place] meds to help ease his pain.  Numeric Pain Rating Scale and Functional Assessment Average Pain 3   In the last MONTH (on 0-10 scale) has pain interfered with the following?  1. General activity like being  able to carry out your everyday physical activities such as walking, climbing stairs, carrying groceries, or moving a chair?  Rating(6)   +Driver, -BT, -Dye Allergies.

## 2021-07-13 NOTE — Procedures (Signed)
Lumbosacral Transforaminal Epidural Steroid Injection - Sub-Pedicular Approach with Fluoroscopic Guidance  Patient: Travis Hendricks      Date of Birth: [birth year] MRN: [id number] PCP: Josetta Huddle, MD      Visit Date: 07/13/2021   Universal Protocol:    Date/Time: 07/13/2021  Consent Given By: the patient  Position: PRONE  Additional Comments: Vital signs were monitored before and after the procedure. Patient was prepped and draped in the usual sterile fashion. The correct patient, procedure, and site was verified.   Injection Procedure Details:   Procedure diagnoses: Lumbar radiculopathy [M54.16]    Meds Administered:  Meds ordered this encounter  Medications   methylPREDNISolone acetate (DEPO-MEDROL) injection 80 mg    Laterality: Right  Location/Site: L3  Needle:5.0 in., 22 ga.  Short bevel or Quincke spinal needle  Needle Placement: Transforaminal  Findings:    -Comments: Excellent flow of contrast along the nerve, nerve root and into the epidural space.  Procedure Details: After squaring off the end-plates to get a true AP view, the C-arm was positioned so that an oblique view of the foramen as noted above was visualized. The target area is just inferior to the "nose of the scotty dog" or sub pedicular. The soft tissues overlying this structure were infiltrated with 2-3 ml. of 1% Lidocaine without Epinephrine.  The spinal needle was inserted toward the target using a "trajectory" view along the fluoroscope beam.  Under AP and lateral visualization, the needle was advanced so it did not puncture dura and was located close the 6 O'Clock position of the pedical in AP tracterory. Biplanar projections were used to confirm position. Aspiration was confirmed to be negative for CSF and/or blood. A 1-2 ml. volume of Isovue-250 was injected and flow of contrast was noted at each level. Radiographs were obtained for documentation purposes.   After attaining the desired flow  of contrast documented above, a 0.5 to 1.0 ml test dose of 0.25% Marcaine was injected into each respective transforaminal space.  The patient was observed for 90 seconds post injection.  After no sensory deficits were reported, and normal lower extremity motor function was noted,   the above injectate was administered so that equal amounts of the injectate were placed at each foramen (level) into the transforaminal epidural space.   Additional Comments:  No complications occurred Dressing: 2 x 2 sterile gauze and Band-Aid    Post-procedure details: Patient was observed during the procedure. Post-procedure instructions were reviewed.  Patient left the clinic in stable condition.

## 2021-07-13 NOTE — Progress Notes (Signed)
Travis Hendricks - 81 y.o. male MRN [id number]  Date of birth: [birth year]  Office Visit Note: Visit Date: 07/13/2021 PCP: Josetta Huddle, MD Referred by: Josetta Huddle, MD  Subjective: Chief Complaint  Patient presents with   Lower Back - Pain   HPI:  Travis Hendricks is a 81 y.o. male who comes in today at the request of Barnet Pall, FNP for planned Right L3-4 Lumbar Transforaminal epidural steroid injection with fluoroscopic guidance.  The patient has failed conservative care including home exercise, medications, time and activity modification.  This injection will be diagnostic and hopefully therapeutic.  Please see requesting physician notes for further details and justification.   ROS Otherwise per HPI.  Assessment & Plan: Visit Diagnoses: No diagnosis found.  Plan: No additional findings.   Meds & Orders: No orders of the defined types were placed in this encounter.  No orders of the defined types were placed in this encounter.   Follow-up: No follow-ups on file.   Procedures: No procedures performed      Clinical History: EXAM: MRI LUMBAR SPINE WITHOUT CONTRAST   TECHNIQUE: Multiplanar, multisequence MR imaging of the lumbar spine was performed. No intravenous contrast was administered.   COMPARISON:  MRI 08/07/2019   FINDINGS: Segmentation:  Standard.   Alignment:  Physiologic.   Vertebrae: No fracture, evidence of discitis, or suspicious bone lesion. Scattered benign intraosseous hemangiomas. Chronic discogenic endplate marrow changes of L4-5.   Conus medullaris and cauda equina: Conus extends to the L1 level. Conus and cauda equina appear normal.   Paraspinal and other soft tissues: Negative.   Disc levels:   T12-L1: Unremarkable.   L1-L2: Minimal disc bulge with posterior annular fissure. Facet joints within normal limits. Borderline-mild right foraminal stenosis. No canal stenosis. Unchanged.   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 Mild bilateral facet arthropathy and ligamentum flavum buckling. Findings contribute to moderate canal stenosis. No foraminal stenosis. Findings have worsened from prior.   L3-L4: Broad-based disc bulge with right greater than left biforaminal protrusions, slightly progressed. Bilateral facet arthropathy and ligamentum flavum buckling. Findings result in moderate right and mild left subarticular recess stenosis without significant canal stenosis. Borderline-mild bilateral foraminal stenosis. Findings progressed from prior.   L4-L5: Prior laminectomy changes. Chronic disc height loss with mild endplate ridging. Mild bilateral facet arthropathy. Mild-moderate left and mild right foraminal stenosis. No canal stenosis. Unchanged.   L5-S1: Disc height loss with mild endplate ridging. Mild bilateral facet arthropathy. Mild right-sided foraminal stenosis. No canal stenosis. Unchanged.   IMPRESSION: 1. 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 2. 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 3. Mild-moderate left and mild right foraminal stenosis at L4-5.     Electronically Signed   By: Davina Poke D.O.   On: 07/08/2021 11:02     Objective:  VS:  HT:     WT:    BMI:      BP:(!) 146/95   HR:92bpm   TEMP: ( )   RESP:  Physical Exam Vitals and nursing note reviewed.  Constitutional:      General: He is not in acute distress.    Appearance: Normal appearance. He is not ill-appearing.  HENT:     Head: Normocephalic and atraumatic.     Right Ear: External ear normal.     Left Ear: External  ear normal.     Nose: No congestion.  Eyes:     Extraocular Movements: Extraocular movements intact.  Cardiovascular:     Rate and Rhythm: Normal rate.     Pulses: Normal pulses.  Pulmonary:     Effort: Pulmonary effort is  normal. No respiratory distress.  Abdominal:     General: There is no distension.     Palpations: Abdomen is soft.  Musculoskeletal:        General: No tenderness or signs of injury.     Cervical back: Neck supple.     Right lower leg: No edema.     Left lower leg: No edema.     Comments: Patient has good distal strength without clonus.  Skin:    Findings: No erythema or rash.  Neurological:     General: No focal deficit present.     Mental Status: He is alert and oriented to person, place, and time.     Sensory: No sensory deficit.     Motor: No weakness or abnormal muscle tone.     Coordination: Coordination normal.  Psychiatric:        Mood and Affect: Mood normal.        Behavior: Behavior normal.     Imaging: No results found.

## 2021-07-16 ENCOUNTER — Other Ambulatory Visit: Payer: Self-pay

## 2021-07-16 DIAGNOSIS — G8929 Other chronic pain: Secondary | ICD-10-CM

## 2021-07-16 DIAGNOSIS — M545 Low back pain, unspecified: Secondary | ICD-10-CM

## 2021-07-21 DIAGNOSIS — R030 Elevated blood-pressure reading, without diagnosis of hypertension: Secondary | ICD-10-CM | POA: Diagnosis not present

## 2021-07-24 NOTE — Procedures (Signed)
Lumbosacral Transforaminal Epidural Steroid Injection - Sub-Pedicular Approach with Fluoroscopic Guidance  Patient: Travis Hendricks      Date of Birth: 1940-08-10 MRN: [id number] PCP: Josetta Huddle, MD      Visit Date: 07/01/2021   Universal Protocol:    Date/Time: 07/01/2021  Consent Given By: the patient  Position: PRONE  Additional Comments: Vital signs were monitored before and after the procedure. Patient was prepped and draped in the usual sterile fashion. The correct patient, procedure, and site was verified.   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  Short bevel or Quincke spinal needle  Needle Placement: Transforaminal  Findings:    -Comments: Excellent flow of contrast along the nerve, nerve root and into the epidural space.  Procedure Details: After squaring off the end-plates to get a true AP view, the C-arm was positioned so that an oblique view of the foramen as noted above was visualized. The target area is just inferior to the "nose of the scotty dog" or sub pedicular. The soft tissues overlying this structure were infiltrated with 2-3 ml. of 1% Lidocaine without Epinephrine.  The spinal needle was inserted toward the target using a "trajectory" view along the fluoroscope beam.  Under AP and lateral visualization, the needle was advanced so it did not puncture dura and was located close the 6 O'Clock position of the pedical in AP tracterory. Biplanar projections were used to confirm position. Aspiration was confirmed to be negative for CSF and/or blood. A 1-2 ml. volume of Isovue-250 was injected and flow of contrast was noted at each level. Radiographs were obtained for  documentation purposes.   After attaining the desired flow of contrast documented above, a 0.5 to 1.0 ml test dose of 0.25% Marcaine was injected into each respective transforaminal space.  The patient was observed for 90 seconds post injection.  After no sensory deficits were reported, and normal lower extremity motor function was noted,   the above injectate was administered so that equal amounts of the injectate were placed at each foramen (level) into the transforaminal epidural space.   Additional Comments:  The patient tolerated the procedure well Dressing: 2 x 2 sterile gauze and Band-Aid    Post-procedure details: Patient was observed during the procedure. Post-procedure instructions were reviewed.  Patient left the clinic in stable condition.

## 2021-07-24 NOTE — Progress Notes (Signed)
Travis Hendricks - 81 y.o. male MRN [id number]  Date of birth: Nov 26, 1940  Office Visit Note: Visit Date: 07/01/2021 PCP: Josetta Huddle, MD Referred by: [name], MD  Subjective: Chief Complaint  Patient presents with   Lower Back - Pain   Right Leg - Pain   HPI:  Travis Hendricks is a 81 y.o. male who comes in today for orthopedics to be a planned facet joint block but now his symptoms have really declared itself to more radicular pain probably an L3 or L2 radicular pain in a dermatome.  Given that fact we are going to complete a right L3 transforaminal injection today.  His pain is increased severely he is now having to use a cane with increasing pain with weightbearing.  Depending on relief with injection he really needs to complete the lumbar spine MRI that has been ordered.  ROS Otherwise per HPI.  Assessment & Plan: Visit Diagnoses:    ICD-10-CM   1. 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 Lumbar radiculopathy  M54.16 XR C-ARM NO REPORT    Epidural Steroid injection    DISCONTINUED: methylPREDNISolone acetate (DEPO-MEDROL) injection 80 mg    CANCELED: Facet Injection      Plan: No additional findings.   Meds & Orders:  Meds ordered this encounter  Medications   DISCONTD: methylPREDNISolone acetate (DEPO-MEDROL) injection 80 mg    Orders Placed This Encounter  Procedures   XR C-ARM NO REPORT   Epidural Steroid injection    Follow-up: Return if symptoms worsen or fail to improve.   Procedures: No procedures performed  Lumbosacral Transforaminal Epidural Steroid Injection - Sub-Pedicular Approach with Fluoroscopic Guidance  Patient: Travis Hendricks      Date of Birth: 07/19/1940 MRN: [id number] PCP: Josetta Huddle, MD      Visit Date: 07/01/2021   Universal Protocol:    Date/Time: 07/01/2021  Consent Given By: the patient  Position: PRONE  Additional Comments: Vital signs were monitored before and after the  procedure. Patient was prepped and draped in the usual sterile fashion. The correct patient, procedure, and site was verified.   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  Short bevel or Quincke spinal needle  Needle Placement: Transforaminal  Findings:    -Comments: Excellent flow of contrast along the nerve, nerve root and into the epidural space.  Procedure Details: After squaring off the end-plates to get a true AP view, the C-arm was positioned so that an oblique view of the foramen as noted above was visualized. The target area is just inferior to the "nose of the scotty dog" or sub pedicular. The soft tissues overlying this structure were infiltrated with 2-3 ml. of 1% Lidocaine without Epinephrine.  The spinal needle was inserted toward the target using a "trajectory" view along the fluoroscope beam.  Under AP and lateral visualization, the needle was advanced so it did not puncture dura and was located close the 6 O'Clock position of the pedical in AP tracterory. Biplanar projections were used to confirm position. Aspiration was confirmed to be negative for CSF and/or blood. A 1-2 ml. volume of Isovue-250 was injected and flow of contrast was noted at each level. Radiographs were obtained  for documentation purposes.   After attaining the desired flow of contrast documented above, a 0.5 to 1.0 ml test dose of 0.25% Marcaine was injected into each respective transforaminal space.  The patient was observed for 90 seconds post injection.  After no sensory deficits were reported, and normal lower extremity motor function was noted,   the above injectate was administered so that equal amounts of the  injectate were placed at each foramen (level) into the transforaminal epidural space.   Additional Comments:  The patient tolerated the procedure well Dressing: 2 x 2 sterile gauze and Band-Aid    Post-procedure details: Patient was observed during the procedure. Post-procedure instructions were reviewed.  Patient left the clinic in stable condition.     Clinical History:     Objective:  VS:  HT:     WT:    BMI:      BP:    HR: bpm   TEMP: ( )   RESP:  Physical Exam Vitals and nursing note reviewed.  Constitutional:      General: He is not in acute distress.    Appearance: Normal appearance. He is not ill-appearing.  HENT:     Head: Normocephalic and atraumatic.     Right Ear: External ear normal.     Left Ear: External ear normal.     Nose: No congestion.  Eyes:     Extraocular Movements: Extraocular movements intact.  Cardiovascular:     Rate and Rhythm: Normal rate.     Pulses: Normal pulses.  Pulmonary:     Effort: Pulmonary effort is normal. No respiratory distress.  Abdominal:     General: There is no distension.     Palpations: Abdomen is soft.  Musculoskeletal:        General: No tenderness or signs of injury.     Cervical back: Neck supple.     Right lower leg: No edema.     Left lower leg: No edema.     Comments: Patient has good distal strength without clonus.  Skin:    Findings: No erythema or rash.  Neurological:     General: No focal deficit present.     Mental Status: He is alert and oriented to person, place, and time.     Sensory: No sensory deficit.     Motor: No weakness or abnormal muscle tone.     Coordination: Coordination normal.  Psychiatric:        Mood and Affect: Mood normal.        Behavior: Behavior normal.     Imaging: No results found.

## 2021-07-26 NOTE — Therapy (Signed)
OUTPATIENT PHYSICAL THERAPY THORACOLUMBAR EVALUATION   Patient Name: Travis Hendricks MRN: [id number] DOB:04/11/1941, 81 y.o., male Today's Date: 07/27/2021   PT End of Session - 07/27/21 1427     Visit Number 1    Number of Visits 20    Date for PT Re-Evaluation 10/05/21    Authorization Type Medicare, [employer]    Progress Note Due on Visit 10    PT Start Time 1430    PT Stop Time 1505    PT Time Calculation (min) 35 min    Activity Tolerance Patient tolerated treatment well    Behavior During Therapy WFL for tasks assessed/performed             Past Medical History:  Diagnosis Date   Basal cell carcinoma    Diverticulosis    Other and unspecified hyperlipidemia    Prostate cancer ([place])    Past Surgical History:  Procedure Laterality Date   CATARACT EXTRACTION W/ INTRAOCULAR LENS IMPLANT     bilaterally   COLONOSCOPY     Tics   ELBOW SURGERY     torn ligament   HERNIA REPAIR     KNEE ARTHROSCOPY     VASECTOMY     Patient Active Problem List   Diagnosis Date Noted   Chronic kidney disease, stage 3 unspecified ([place]) 09/14/2020   Cough 09/14/2020   Diarrhea 09/14/2020   ED (erectile dysfunction) of organic origin 09/14/2020   Elevated blood-pressure reading without diagnosis of hypertension 09/14/2020   Elevated PSA 09/14/2020   Encounter for general adult medical examination without abnormal findings 09/14/2020   Encounter for screening for other disorder 09/14/2020   Encounter for immunization 09/14/2020   Encounter for general adult medical examination with abnormal findings 09/14/2020   Infectious colitis, enteritis and gastroenteritis 09/14/2020   Left lower quadrant pain 09/14/2020   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/14/2020   Other long term (current) drug therapy 34/19/3790   Periumbilical abdominal pain 09/14/2020   Polymyalgia rheumatica ([place]) 09/14/2020   Primary gout 09/14/2020   Malignant neoplasm of prostate ([place])  09/14/2020   Lumbar radiculopathy 09/14/2020   Vitamin D deficiency 09/14/2020   Weight loss 09/14/2020   Body mass index (BMI) 25.0-25.9, adult 01/17/2020   Essential (primary) hypertension 01/17/2020   Other chronic pain 01/17/2020   Spondylosis 01/17/2020   Osteoarthritis of left hip 11/29/2017   Osteoarthritis of right hip 11/29/2017   Basal cell carcinoma 11/25/2015   VENOUS INSUFFICIENCY, MILD 12/23/2008   DIVERTICULOSIS, COLON 06/09/2008   LOW BACK PAIN, CHRONIC 06/09/2008   HYPERLIPIDEMIA 07/21/2006   GILBERT'S SYNDROME 07/21/2006    PCP: Josetta Huddle, MD  REFERRING PROVIDER: Newt Minion, MD  REFERRING DIAG: M54.50,G89.29 (ICD-10-CM) - Chronic midline low back pain without sciatica  THERAPY DIAG:  Chronic midline low back pain with right-sided sciatica  Abnormal posture  ONSET DATE: 05/20/2021 (Reported a couple months)  SUBJECTIVE:  SUBJECTIVE STATEMENT: Patient indicated he was transplanting plants and felt twist of back. Patient indicated complaints of back pain c two injections in back that has helped about 70%.  Patient indicated he likes to play pickleball everyday but limited at this time.  Walking and bending also troublesome.  Denied trouble sleeping due to pain symptoms.   PERTINENT HISTORY:  History of prostate cancer, basal cell carcinoma, hyperlipidemia  PAIN:  Are you having pain? Yes NPRS scale: at worst 8/10, currently 0/10 Pain location: back, Rt leg to knee Pain orientation: Right  PAIN TYPE: chronic Pain description: sharp pain Aggravating factors: pickle ball activity, prolonged walking, standing/walking  Relieving factors: injections  PRECAUTIONS: None  WEIGHT BEARING RESTRICTIONS No  FALLS:  Has patient fallen in last 6 months? No, Number of falls:  0   LIVING ENVIRONMENT: Stairs: doesn't have to go up/down the stairs at home   PLOF: Independent, pickle ball, walking prolonged, gym    PATIENT GOALS Reduce pain   OBJECTIVE:   DIAGNOSTIC FINDINGS:  MRI (see in chart review)  PATIENT SURVEYS:  FOTO 07/27/2021 intake:  47 predicted:  62  SCREENING FOR RED FLAGS: Bowel or bladder incontinence: No Cauda equina syndrome: No  COGNITION:  Overall cognitive status: Within functional limits for tasks assessed     SENSATION:  Light touch: Appears intact   POSTURE:  07/27/2021: mild lateral shift to Lt (shoulder to Lt on hips).  Easily corrected c overpressure with no pain noted  PALPATION: 07/27/2021: no specific tenderness noted in lumbar region  LUMBARAROM/PROM  A/PROM AROM 07/27/2021  Flexion Movement to mid shin c peripheralization  Extension 5% c ERP lumbar.  Prone press up on table painfree c indications of centralization  Right lateral flexion To mid thigh c no complaints  Left lateral flexion To mid thigh c no complaints  Right rotation   Left rotation    (Blank rows = not tested)  LE AROM/PROM:  A/PROM Right 07/27/2021 Left 07/27/2021  Hip flexion    Hip extension    Hip abduction    Hip adduction    Hip internal rotation    Hip external rotation    Knee flexion    Knee extension    Ankle dorsiflexion    Ankle plantarflexion    Ankle inversion    Ankle eversion     (Blank rows = not tested)  LE MMT:  MMT Right 07/27/2021 Left 07/27/2021  Hip flexion    Hip extension    Hip abduction    Hip adduction    Hip internal rotation    Hip external rotation    Knee flexion    Knee extension    Ankle dorsiflexion    Ankle plantarflexion    Ankle inversion    Ankle eversion     (Blank rows = not tested)  LUMBAR SPECIAL TESTS:  07/27/2021: recommend slump testing and crossed SLR next visit   GAIT: 07/27/2021: independent ambulation c mild lateral shift Lt    TODAY'S TREATMENT  07/27/2021:  Therex:   HEP instruction/performance c cues for techniques, handout provided.  Trial set performed of each for comprehension and symptom assessment. See below for listing of exercises included. Manual:   Standing manual trunk shift correction (hips movement to Left) 5 sec holds    PATIENT EDUCATION:  07/27/2021: Education details: HEP, POC, centralization/directional preference Person educated: Patient Education method: Explanation, Demonstration, Verbal cues, and Handouts Education comprehension: verbalized understanding and returned demonstration   HOME EXERCISE PROGRAM: Access  Code: [email or web address] URL: [email or web address] Date: 07/27/2021 Prepared by: Scot Jun  Exercises Left Standing Lateral Shift Correction at Wall - Repetitions - 3-5 x daily - 7 x weekly - 1 sets - 10 reps - 2-3 hold Prone Press Up - 3-5 x daily - 7 x weekly - 1 sets - 10 reps - 1-2 hold Standing Lumbar Extension - 3-5 x daily - 7 x weekly - 1 sets - 10 reps - 1-2 hold   ASSESSMENT:  CLINICAL IMPRESSION: Patient is a 81  y.o. who comes to clinic with complaints of chronic low back pain with mobility, strength and movement coordination deficits that impair their ability to perform usual daily and recreational functional activities without increase difficulty/symptoms at this time.  Patient to benefit from skilled PT services to address impairments and limitations to improve to previous level of function without restriction secondary to condition. Objective impairments include decreased activity tolerance, decreased balance, decreased coordination, decreased endurance, decreased mobility, difficulty walking, decreased ROM, decreased strength, hypomobility, impaired flexibility, improper body mechanics, postural dysfunction, and pain. These impairments are limiting patient from community activity, yard work, and Tree surgeon . Personal factors including History of prostate cancer, basal cell carcinoma,  hyperlipidemia are also affecting patient's functional outcome. Patient will benefit from skilled PT to address above impairments and improve overall function.  REHAB POTENTIAL: Good  CLINICAL DECISION MAKING: Stable/uncomplicated  EVALUATION COMPLEXITY: Low   GOALS: Goals reviewed with patient? Yes  SHORT TERM GOALS:  STG Name Target Date Goal status  1 Patient will demonstrate independent use of home exercise program to maintain progress from in clinic treatments.  08/17/2021 INITIAL                                 LONG TERM GOALS:   LTG Name Target Date Goal status  1 Patient will demonstrate/report pain at worst less than or equal to 2/10 to facilitate minimal limitation in daily activity secondary to pain symptoms.  10/05/2021 INITIAL  2 Patient will demonstrate independent use of home exercise program to facilitate ability to maintain/progress functional gains from skilled physical therapy services.  10/05/2021 INITIAL  3 Patient will demonstrate FOTO outcome > or = 62% to indicated reduced disability due to condition. 10/05/2021 INITIAL  4 Patient will demonstrate lumbar extension 100 % WFL s symptoms to facilitate upright standing, walking posture at PLOF s limitation. 10/05/2021 INITIAL  5 Patient will demonstrate/report ability to walk unrestricted for usual hobbies/exercise.  10/05/2021 INITIAL  6 Patient will demonstrate/report ability to perform usual pickle ball return at PLOF.  10/05/2021 INITIAL        PLAN: PT FREQUENCY: 1-2x/week  PT DURATION: 10 weeks  PLANNED INTERVENTIONS: Therapeutic exercises, Therapeutic activity, Neuro Muscular re-education, Balance training, Gait training, Patient/Family education, Joint mobilization, Stair training, DME instructions, Dry Needling, Electrical stimulation, Cryotherapy, Moist heat, Taping, Ultrasound, Ionotophoresis 4mg /ml Dexamethasone, and Manual therapy.  All included unless contraindicated  PLAN FOR NEXT SESSION: Review  existing HEP, recheck centralization   Scot Jun, PT, DPT, OCS, ATC 07/27/21  3:16 PM

## 2021-07-27 ENCOUNTER — Ambulatory Visit (INDEPENDENT_AMBULATORY_CARE_PROVIDER_SITE_OTHER): Payer: Medicare Other | Admitting: Rehabilitative and Restorative Service Providers"

## 2021-07-27 ENCOUNTER — Encounter: Payer: Self-pay | Admitting: Rehabilitative and Restorative Service Providers"

## 2021-07-27 ENCOUNTER — Other Ambulatory Visit: Payer: Self-pay

## 2021-07-27 DIAGNOSIS — M545 Low back pain, unspecified: Secondary | ICD-10-CM | POA: Diagnosis not present

## 2021-07-27 DIAGNOSIS — G8929 Other chronic pain: Secondary | ICD-10-CM | POA: Diagnosis not present

## 2021-07-27 DIAGNOSIS — R293 Abnormal posture: Secondary | ICD-10-CM

## 2021-08-03 ENCOUNTER — Telehealth: Payer: Self-pay

## 2021-08-03 ENCOUNTER — Encounter: Payer: Self-pay | Admitting: Physical Therapy

## 2021-08-03 ENCOUNTER — Other Ambulatory Visit: Payer: Self-pay

## 2021-08-03 ENCOUNTER — Ambulatory Visit (INDEPENDENT_AMBULATORY_CARE_PROVIDER_SITE_OTHER): Payer: Medicare Other | Admitting: Physical Therapy

## 2021-08-03 DIAGNOSIS — G8929 Other chronic pain: Secondary | ICD-10-CM | POA: Diagnosis not present

## 2021-08-03 DIAGNOSIS — R293 Abnormal posture: Secondary | ICD-10-CM | POA: Diagnosis not present

## 2021-08-03 DIAGNOSIS — M5441 Lumbago with sciatica, right side: Secondary | ICD-10-CM

## 2021-08-03 NOTE — Telephone Encounter (Signed)
Pt would like a repeat injection with FN said that he feels about 60% better. Can you please call pt to sch per Dr. Sharol Given. Thank you!

## 2021-08-03 NOTE — Therapy (Signed)
OUTPATIENT PHYSICAL THERAPY TREATMENT NOTE   Patient Name: Travis Hendricks MRN: [id number] DOB:Feb 10, 1941, 81 y.o., male Today's Date: 08/03/2021  PCP: Josetta Huddle, MD REFERRING PROVIDER: Newt Minion, MD   PT End of Session - 08/03/21 1301     Visit Number 2    Number of Visits 20    Date for PT Re-Evaluation 10/05/21    Authorization Type Medicare, [employer]    Progress Note Due on Visit 10    PT Start Time 1300    PT Stop Time 1340    PT Time Calculation (min) 40 min    Activity Tolerance Patient tolerated treatment well    Behavior During Therapy WFL for tasks assessed/performed             Past Medical History:  Diagnosis Date   Basal cell carcinoma    Diverticulosis    Other and unspecified hyperlipidemia    Prostate cancer ([place])    Past Surgical History:  Procedure Laterality Date   CATARACT EXTRACTION W/ INTRAOCULAR LENS IMPLANT     bilaterally   COLONOSCOPY     Tics   ELBOW SURGERY     torn ligament   HERNIA REPAIR     KNEE ARTHROSCOPY     VASECTOMY     Patient Active Problem List   Diagnosis Date Noted   Chronic kidney disease, stage 3 unspecified ([place]) 09/14/2020   Cough 09/14/2020   Diarrhea 09/14/2020   ED (erectile dysfunction) of organic origin 09/14/2020   Elevated blood-pressure reading without diagnosis of hypertension 09/14/2020   Elevated PSA 09/14/2020   Encounter for general adult medical examination without abnormal findings 09/14/2020   Encounter for screening for other disorder 09/14/2020   Encounter for immunization 09/14/2020   Encounter for general adult medical examination with abnormal findings 09/14/2020   Infectious colitis, enteritis and gastroenteritis 09/14/2020   Left lower quadrant pain 09/14/2020   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/14/2020   Other long term (current) drug therapy 90/24/0973   Periumbilical abdominal pain 09/14/2020   Polymyalgia rheumatica ([place]) 09/14/2020   Primary gout  09/14/2020   Malignant neoplasm of prostate ([place]) 09/14/2020   Lumbar radiculopathy 09/14/2020   Vitamin D deficiency 09/14/2020   Weight loss 09/14/2020   Body mass index (BMI) 25.0-25.9, adult 01/17/2020   Essential (primary) hypertension 01/17/2020   Other chronic pain 01/17/2020   Spondylosis 01/17/2020   Osteoarthritis of left hip 11/29/2017   Osteoarthritis of right hip 11/29/2017   Basal cell carcinoma 11/25/2015   VENOUS INSUFFICIENCY, MILD 12/23/2008   DIVERTICULOSIS, COLON 06/09/2008   LOW BACK PAIN, CHRONIC 06/09/2008   HYPERLIPIDEMIA 07/21/2006   GILBERT'S SYNDROME 07/21/2006    REFERRING DIAG: Newt Minion, MD  THERAPY DIAG:  Chronic midline low back pain with right-sided sciatica  Abnormal posture  PERTINENT HISTORY: History of prostate cancer, basal cell carcinoma, hyperlipidemia  PRECAUTIONS: None  SUBJECTIVE: "not good." Is able to do the elliptical, but can't play pickleball.  Reports symptoms are about the same, exercises provide temporary relief.  PAIN:  Are you having pain? Yes NPRS scale: "I can feel it, but I wouldn't say it's painful"/10 Pain location: Rt hip to ant thigh/knee Pain orientation: Right  PAIN TYPE: subacute Pain description: sharp  Aggravating factors: forward bending Relieving factors: extension exercises    OBJECTIVE:      PATIENT SURVEYS:  FOTO 07/27/2021 intake:  47 predicted:  62   POSTURE:  07/27/2021: mild lateral shift to Lt (shoulder to  Lt on hips).  Easily corrected c overpressure with no pain noted   LUMBARAROM/PROM   A/PROM AROM 07/27/2021  Flexion Movement to mid shin c peripheralization  Extension 5% c ERP lumbar.  Prone press up on table painfree c indications of centralization  Right lateral flexion To mid thigh c no complaints  Left lateral flexion To mid thigh c no complaints  Right rotation    Left rotation     (Blank rows = not tested)   LE AROM/PROM:   A/PROM Right 07/27/2021 Left 07/27/2021  Hip  flexion      Hip extension      Hip abduction      Hip adduction      Hip internal rotation      Hip external rotation      Knee flexion      Knee extension      Ankle dorsiflexion      Ankle plantarflexion      Ankle inversion      Ankle eversion       (Blank rows = not tested)   LE MMT:   MMT Right 07/27/2021 Left 07/27/2021  Hip flexion      Hip extension      Hip abduction      Hip adduction      Hip internal rotation      Hip external rotation      Knee flexion      Knee extension      Ankle dorsiflexion      Ankle plantarflexion      Ankle inversion      Ankle eversion       (Blank rows = not tested)   LUMBAR SPECIAL TESTS:  07/27/2021: recommend slump testing and crossed SLR next visit 08/03/21: Slump neg bil; Crossed SLR Neg bil        TODAY'S TREATMENT  08/03/21 Therex:  NuStep L6 x5 min (LEs only)  Rt lateral shift correction 10 x 10 sec hold  Prone press up 10 x 10 sec hold  Standing lumbar extension 10 x 10 sec hold  Supine isometric hip flexion in tabletop 10 x 10 sec hold  Prone alt hip ext 10 x 5 sec hold bil  Prone hip/arm extension 10 x 5 sec hold bil     07/27/2021:  Therex:      HEP instruction/performance c cues for techniques, handout provided.  Trial set performed of each for comprehension and symptom assessment. See below for listing of exercises included. Manual:     Standing manual trunk shift correction (hips movement to Left) 5 sec holds       PATIENT EDUCATION:  07/27/2021: Education details: HEP, POC, centralization/directional preference Person educated: Patient Education method: Explanation, Demonstration, Verbal cues, and Handouts Education comprehension: verbalized understanding and returned demonstration     HOME EXERCISE PROGRAM: Access Code: [email or web address] URL: [email or web address] Date: 07/27/2021 Prepared by: Scot Jun   Exercises Left Standing Lateral Shift Correction at Wall - Repetitions - 3-5 x daily -  7 x weekly - 1 sets - 10 reps - 2-3 hold Prone Press Up - 3-5 x daily - 7 x weekly - 1 sets - 10 reps - 1-2 hold Standing Lumbar Extension - 3-5 x daily - 7 x weekly - 1 sets - 10 reps - 1-2 hold     ASSESSMENT:   CLINICAL IMPRESSION: Pt continues to have good response to extension based HEP.  Discussed gym exercises and to modify based on pain  response.  Pt does have abdominal doming with core activity, so discussed modifications and need to monitor when doing core exercises at the gym.  Recommended he continue to avoid forward flexion until symptoms improve and then can gradually work into flexion as symptoms allow.  Will continue to benefit from PT to maximize function.   Patient to benefit from skilled PT services to address impairments and limitations to improve to previous level of function without restriction secondary to condition. Objective impairments include decreased activity tolerance, decreased balance, decreased coordination, decreased endurance, decreased mobility, difficulty walking, decreased ROM, decreased strength, hypomobility, impaired flexibility, improper body mechanics, postural dysfunction, and pain. These impairments are limiting patient from community activity, yard work, and Tree surgeon . Personal factors including History of prostate cancer, basal cell carcinoma, hyperlipidemia are also affecting patient's functional outcome. Patient will benefit from skilled PT to address above impairments and improve overall function.   REHAB POTENTIAL: Good   CLINICAL DECISION MAKING: Stable/uncomplicated   EVALUATION COMPLEXITY: Low     GOALS: Goals reviewed with patient? Yes   SHORT TERM GOALS:   STG Name Target Date Goal status  1 Patient will demonstrate independent use of home exercise program to maintain progress from in clinic treatments.  08/17/2021 INITIAL                                                          LONG TERM GOALS:    LTG Name Target  Date Goal status  1 Patient will demonstrate/report pain at worst less than or equal to 2/10 to facilitate minimal limitation in daily activity secondary to pain symptoms.  10/05/2021 INITIAL  2 Patient will demonstrate independent use of home exercise program to facilitate ability to maintain/progress functional gains from skilled physical therapy services.  10/05/2021 INITIAL  3 Patient will demonstrate FOTO outcome > or = 62% to indicated reduced disability due to condition. 10/05/2021 INITIAL  4 Patient will demonstrate lumbar extension 100 % WFL s symptoms to facilitate upright standing, walking posture at PLOF s limitation. 10/05/2021 INITIAL  5 Patient will demonstrate/report ability to walk unrestricted for usual hobbies/exercise.  10/05/2021 INITIAL  6 Patient will demonstrate/report ability to perform usual pickle ball return at PLOF.  10/05/2021 INITIAL             PLAN: PT FREQUENCY: 1-2x/week   PT DURATION: 10 weeks   PLANNED INTERVENTIONS: Therapeutic exercises, Therapeutic activity, Neuro Muscular re-education, Balance training, Gait training, Patient/Family education, Joint mobilization, Stair training, DME instructions, Dry Needling, Electrical stimulation, Cryotherapy, Moist heat, Taping, Ultrasound, Ionotophoresis 4mg /ml Dexamethasone, and Manual therapy.  All included unless contraindicated   PLAN FOR NEXT SESSION: continue core work, update HEP PRN, extension based exercise     Laureen Abrahams, PT, DPT 08/03/21 2:03 PM

## 2021-08-10 ENCOUNTER — Encounter: Payer: Medicare Other | Admitting: Rehabilitative and Restorative Service Providers"

## 2021-08-12 ENCOUNTER — Telehealth: Payer: Self-pay | Admitting: Physical Medicine and Rehabilitation

## 2021-08-12 DIAGNOSIS — Z961 Presence of intraocular lens: Secondary | ICD-10-CM | POA: Diagnosis not present

## 2021-08-12 DIAGNOSIS — H35372 Puckering of macula, left eye: Secondary | ICD-10-CM | POA: Diagnosis not present

## 2021-08-12 NOTE — Telephone Encounter (Signed)
Patient called advised he is having a lot of pain in his right knee. Patient wanted to know if the injection will also help with the pain in his right knee?  The number to contact patient is [phone]

## 2021-08-13 DIAGNOSIS — I10 Essential (primary) hypertension: Secondary | ICD-10-CM | POA: Diagnosis not present

## 2021-08-17 ENCOUNTER — Encounter: Payer: Medicare Other | Admitting: Rehabilitative and Restorative Service Providers"

## 2021-08-17 ENCOUNTER — Encounter: Payer: Self-pay | Admitting: Physical Medicine and Rehabilitation

## 2021-08-17 ENCOUNTER — Ambulatory Visit (INDEPENDENT_AMBULATORY_CARE_PROVIDER_SITE_OTHER): Payer: Medicare Other | Admitting: Physical Medicine and Rehabilitation

## 2021-08-17 ENCOUNTER — Ambulatory Visit: Payer: Self-pay

## 2021-08-17 ENCOUNTER — Other Ambulatory Visit: Payer: Self-pay

## 2021-08-17 VITALS — BP 142/85 | HR 64

## 2021-08-17 DIAGNOSIS — M5416 Radiculopathy, lumbar region: Secondary | ICD-10-CM

## 2021-08-17 DIAGNOSIS — M5116 Intervertebral disc disorders with radiculopathy, lumbar region: Secondary | ICD-10-CM

## 2021-08-17 MED ORDER — METHYLPREDNISOLONE ACETATE 80 MG/ML IJ SUSP
80.0000 mg | Freq: Once | INTRAMUSCULAR | Status: AC
  Administered 2021-08-17: 80 mg

## 2021-08-17 NOTE — Progress Notes (Signed)
Pt state lower back pain that travels to his right leg. Pt state walking and standing makes the pain worse. Pt state he take [place] meds to help ease his pain. Pt has hx of inj 07/13/21 pt state helped help 60% of his pain.  Numeric Pain Rating Scale and Functional Assessment Average Pain 3   In the last MONTH (on 0-10 scale) has pain interfered with the following?  1. General activity like being  able to carry out your everyday physical activities such as walking, climbing stairs, carrying groceries, or moving a chair?  Rating(10)   +Driver, -BT, -Dye Allergies.

## 2021-08-17 NOTE — Patient Instructions (Signed)

## 2021-08-24 ENCOUNTER — Encounter: Payer: Medicare Other | Admitting: Rehabilitative and Restorative Service Providers"

## 2021-08-25 DIAGNOSIS — M5126 Other intervertebral disc displacement, lumbar region: Secondary | ICD-10-CM | POA: Diagnosis not present

## 2021-08-27 DIAGNOSIS — M5116 Intervertebral disc disorders with radiculopathy, lumbar region: Secondary | ICD-10-CM | POA: Diagnosis not present

## 2021-08-27 DIAGNOSIS — C61 Malignant neoplasm of prostate: Secondary | ICD-10-CM | POA: Diagnosis not present

## 2021-08-27 DIAGNOSIS — M5126 Other intervertebral disc displacement, lumbar region: Secondary | ICD-10-CM | POA: Diagnosis not present

## 2021-08-27 DIAGNOSIS — M48061 Spinal stenosis, lumbar region without neurogenic claudication: Secondary | ICD-10-CM | POA: Diagnosis not present

## 2021-08-29 NOTE — Progress Notes (Signed)
WOODRUFF SKIRVIN - 81 y.o. male MRN [id number]  Date of birth: 1941-03-09  Office Visit Note: Visit Date: 08/17/2021 PCP: Josetta Huddle, MD Referred by: Magnus Sinning, MD  Subjective: Chief Complaint  Patient presents with   Lower Back - Pain   Right Leg - Pain   HPI:  Travis Hendricks is a 81 y.o. male who comes in today for planned repeat Right L3-4  Lumbar Transforaminal epidural steroid injection with fluoroscopic guidance.  The patient has failed conservative care including home exercise, medications, time and activity modification.  This injection will be diagnostic and hopefully therapeutic.  Please see requesting physician notes for further details and justification. Patient received more than 50% pain relief from prior injection.  Overall reporting 60% relief of pain symptoms with some continued paresthesia.  I had a long discussion with him today about the nature of injections in terms of how much relief to expect and the natural history of disc herniations with his condition.  We went ahead and decided to repeat the injection today.  I would not do any further injections for 6 months.  He seems to be doing fairly well overall and is walking better not using a cane.  Referring: Dr. Meridee Score  ROS Otherwise per HPI.  Assessment & Plan: Visit Diagnoses:    ICD-10-CM   1. Lumbar radiculopathy  M54.16 XR C-ARM NO REPORT    Epidural Steroid injection    methylPREDNISolone acetate (DEPO-MEDROL) injection 80 mg    2. Intervertebral disc disorders with radiculopathy, lumbar region  M51.16 XR C-ARM NO REPORT    Epidural Steroid injection    methylPREDNISolone acetate (DEPO-MEDROL) injection 80 mg      Plan: No additional findings.   Meds & Orders:  Meds ordered this encounter  Medications   methylPREDNISolone acetate (DEPO-MEDROL) injection 80 mg    Orders Placed This Encounter  Procedures   XR C-ARM NO REPORT   Epidural Steroid injection    Follow-up: Return if  symptoms worsen or fail to improve.   Procedures: No procedures performed  Lumbosacral Transforaminal Epidural Steroid Injection - Sub-Pedicular Approach with Fluoroscopic Guidance  Patient: Travis Hendricks      Date of Birth: 08/02/1940 MRN: [id number] PCP: Josetta Huddle, MD      Visit Date: 08/17/2021   Universal Protocol:    Date/Time: 08/17/2021  Consent Given By: the patient  Position: PRONE  Additional Comments: Vital signs were monitored before and after the procedure. Patient was prepped and draped in the usual sterile fashion. The correct patient, procedure, and site was verified.   Injection Procedure Details:   Procedure diagnoses: Lumbar radiculopathy [M54.16]    Meds Administered:  Meds ordered this encounter  Medications   methylPREDNISolone acetate (DEPO-MEDROL) injection 80 mg    Laterality: Right  Location/Site: L3  Needle:5.0 in., 22 ga.  Short bevel or Quincke spinal needle  Needle Placement: Transforaminal  Findings:    -Comments: Excellent flow of contrast along the nerve, nerve root and into the epidural space.  Procedure Details: After squaring off the end-plates to get a true AP view, the C-arm was positioned so that an oblique view of the foramen as noted above was visualized. The target area is just inferior to the "nose of the scotty dog" or sub pedicular. The soft tissues overlying this structure were infiltrated with 2-3 ml. of 1% Lidocaine without Epinephrine.  The spinal needle was inserted toward the target using a "trajectory" view along the fluoroscope beam.  Under AP and lateral visualization, the needle was advanced so it did not puncture dura and was located close the 6 O'Clock position of the pedical in AP tracterory. Biplanar projections were used to confirm position. Aspiration was confirmed to be negative for CSF and/or blood. A 1-2 ml. volume of Isovue-250 was injected and flow of contrast was noted at each level. Radiographs  were obtained for documentation purposes.   After attaining the desired flow of contrast documented above, a 0.5 to 1.0 ml test dose of 0.25% Marcaine was injected into each respective transforaminal space.  The patient was observed for 90 seconds post injection.  After no sensory deficits were reported, and normal lower extremity motor function was noted,   the above injectate was administered so that equal amounts of the injectate were placed at each foramen (level) into the transforaminal epidural space.   Additional Comments:  The patient tolerated the procedure well Dressing: 2 x 2 sterile gauze and Band-Aid    Post-procedure details: Patient was observed during the procedure. Post-procedure instructions were reviewed.  Patient left the clinic in stable condition.    Clinical History: No specialty comments available.     Objective:  VS:  HT:     WT:    BMI:      BP:(!) 142/85   HR:64bpm   TEMP: ( )   RESP:  Physical Exam Vitals and nursing note reviewed.  Constitutional:      General: He is not in acute distress.    Appearance: Normal appearance. He is not ill-appearing.  HENT:     Head: Normocephalic and atraumatic.     Right Ear: External ear normal.     Left Ear: External ear normal.     Nose: No congestion.  Eyes:     Extraocular Movements: Extraocular movements intact.  Cardiovascular:     Rate and Rhythm: Normal rate.     Pulses: Normal pulses.  Pulmonary:     Effort: Pulmonary effort is normal. No respiratory distress.  Abdominal:     General: There is no distension.     Palpations: Abdomen is soft.  Musculoskeletal:        General: No tenderness or signs of injury.     Cervical back: Neck supple.     Right lower leg: No edema.     Left lower leg: No edema.     Comments: Patient has good distal strength without clonus.  Skin:    Findings: No erythema or rash.  Neurological:     General: No focal deficit present.     Mental Status: He is alert and  oriented to person, place, and time.     Sensory: No sensory deficit.     Motor: No weakness or abnormal muscle tone.     Coordination: Coordination normal.  Psychiatric:        Mood and Affect: Mood normal.        Behavior: Behavior normal.     Imaging: No results found.

## 2021-08-29 NOTE — Procedures (Signed)
Lumbosacral Transforaminal Epidural Steroid Injection - Sub-Pedicular Approach with Fluoroscopic Guidance  Patient: Travis Hendricks      Date of Birth: 10-01-1940 MRN: [id number] PCP: Josetta Huddle, MD      Visit Date: 08/17/2021   Universal Protocol:    Date/Time: 08/17/2021  Consent Given By: the patient  Position: PRONE  Additional Comments: Vital signs were monitored before and after the procedure. Patient was prepped and draped in the usual sterile fashion. The correct patient, procedure, and site was verified.   Injection Procedure Details:   Procedure diagnoses: Lumbar radiculopathy [M54.16]    Meds Administered:  Meds ordered this encounter  Medications   methylPREDNISolone acetate (DEPO-MEDROL) injection 80 mg    Laterality: Right  Location/Site: L3  Needle:5.0 in., 22 ga.  Short bevel or Quincke spinal needle  Needle Placement: Transforaminal  Findings:    -Comments: Excellent flow of contrast along the nerve, nerve root and into the epidural space.  Procedure Details: After squaring off the end-plates to get a true AP view, the C-arm was positioned so that an oblique view of the foramen as noted above was visualized. The target area is just inferior to the "nose of the scotty dog" or sub pedicular. The soft tissues overlying this structure were infiltrated with 2-3 ml. of 1% Lidocaine without Epinephrine.  The spinal needle was inserted toward the target using a "trajectory" view along the fluoroscope beam.  Under AP and lateral visualization, the needle was advanced so it did not puncture dura and was located close the 6 O'Clock position of the pedical in AP tracterory. Biplanar projections were used to confirm position. Aspiration was confirmed to be negative for CSF and/or blood. A 1-2 ml. volume of Isovue-250 was injected and flow of contrast was noted at each level. Radiographs were obtained for documentation purposes.   After attaining the desired flow  of contrast documented above, a 0.5 to 1.0 ml test dose of 0.25% Marcaine was injected into each respective transforaminal space.  The patient was observed for 90 seconds post injection.  After no sensory deficits were reported, and normal lower extremity motor function was noted,   the above injectate was administered so that equal amounts of the injectate were placed at each foramen (level) into the transforaminal epidural space.   Additional Comments:  The patient tolerated the procedure well Dressing: 2 x 2 sterile gauze and Band-Aid    Post-procedure details: Patient was observed during the procedure. Post-procedure instructions were reviewed.  Patient left the clinic in stable condition.

## 2021-08-31 ENCOUNTER — Encounter: Payer: Medicare Other | Admitting: Rehabilitative and Restorative Service Providers"

## 2021-09-02 DIAGNOSIS — C61 Malignant neoplasm of prostate: Secondary | ICD-10-CM | POA: Diagnosis not present

## 2021-09-03 DIAGNOSIS — R351 Nocturia: Secondary | ICD-10-CM | POA: Diagnosis not present

## 2021-09-03 DIAGNOSIS — N401 Enlarged prostate with lower urinary tract symptoms: Secondary | ICD-10-CM | POA: Diagnosis not present

## 2021-09-03 DIAGNOSIS — N5201 Erectile dysfunction due to arterial insufficiency: Secondary | ICD-10-CM | POA: Diagnosis not present

## 2021-09-03 DIAGNOSIS — C61 Malignant neoplasm of prostate: Secondary | ICD-10-CM | POA: Diagnosis not present

## 2021-09-21 DIAGNOSIS — N5201 Erectile dysfunction due to arterial insufficiency: Secondary | ICD-10-CM | POA: Diagnosis not present

## 2021-10-26 DIAGNOSIS — M179 Osteoarthritis of knee, unspecified: Secondary | ICD-10-CM | POA: Diagnosis not present

## 2021-10-26 DIAGNOSIS — C61 Malignant neoplasm of prostate: Secondary | ICD-10-CM | POA: Diagnosis not present

## 2021-10-26 DIAGNOSIS — N5239 Other post-surgical erectile dysfunction: Secondary | ICD-10-CM | POA: Diagnosis not present

## 2021-10-26 DIAGNOSIS — Z1339 Encounter for screening examination for other mental health and behavioral disorders: Secondary | ICD-10-CM | POA: Diagnosis not present

## 2021-10-26 DIAGNOSIS — E7800 Pure hypercholesterolemia, unspecified: Secondary | ICD-10-CM | POA: Diagnosis not present

## 2021-10-26 DIAGNOSIS — M479 Spondylosis, unspecified: Secondary | ICD-10-CM | POA: Diagnosis not present

## 2021-10-26 DIAGNOSIS — Z1331 Encounter for screening for depression: Secondary | ICD-10-CM | POA: Diagnosis not present

## 2021-10-29 DIAGNOSIS — R310 Gross hematuria: Secondary | ICD-10-CM | POA: Diagnosis not present

## 2021-10-29 DIAGNOSIS — C61 Malignant neoplasm of prostate: Secondary | ICD-10-CM | POA: Diagnosis not present

## 2021-12-08 DIAGNOSIS — D2261 Melanocytic nevi of right upper limb, including shoulder: Secondary | ICD-10-CM | POA: Diagnosis not present

## 2021-12-08 DIAGNOSIS — L821 Other seborrheic keratosis: Secondary | ICD-10-CM | POA: Diagnosis not present

## 2021-12-08 DIAGNOSIS — D1801 Hemangioma of skin and subcutaneous tissue: Secondary | ICD-10-CM | POA: Diagnosis not present

## 2021-12-08 DIAGNOSIS — Z85828 Personal history of other malignant neoplasm of skin: Secondary | ICD-10-CM | POA: Diagnosis not present

## 2021-12-08 DIAGNOSIS — D225 Melanocytic nevi of trunk: Secondary | ICD-10-CM | POA: Diagnosis not present

## 2021-12-08 DIAGNOSIS — D2262 Melanocytic nevi of left upper limb, including shoulder: Secondary | ICD-10-CM | POA: Diagnosis not present

## 2022-01-19 ENCOUNTER — Ambulatory Visit: Payer: Self-pay

## 2022-01-19 NOTE — Patient Outreach (Signed)
  Care Coordination   01/19/2022 Name: Travis Hendricks MRN: [id number] DOB: 05-26-1941   Care Coordination Outreach Attempts:  An unsuccessful telephone outreach was attempted today to offer the patient information about available care coordination services as a benefit of their health plan.   Follow Up Plan:  Additional outreach attempts will be made to offer the patient care coordination information and services.   Encounter Outcome:  No Answer  Care Coordination Interventions Activated:  No   Care Coordination Interventions:  No, not indicated    [facility] Care Management [phone]

## 2022-02-09 ENCOUNTER — Ambulatory Visit: Payer: Self-pay

## 2022-02-09 NOTE — Patient Outreach (Signed)
  Care Coordination   02/09/2022 Name: Travis Hendricks MRN: [id number] DOB: [birth year]   Care Coordination Outreach Attempts:  An unsuccessful telephone outreach was attempted today to offer the patient information about available care coordination services as a benefit of their health plan.   Follow Up Plan:  Additional outreach attempts will be made to offer the patient care coordination information and services.   Encounter Outcome:  No Answer  Care Coordination Interventions Activated:  No   Care Coordination Interventions:  No, not indicated    [place] [facility] [phone]

## 2022-02-09 NOTE — Patient Outreach (Signed)
Care Coordination   Initial Visit Note   02/09/2022 Name: Travis Hendricks MRN: [id number] DOB: [birth year]  Travis Hendricks is a 81 y.o. year old male who sees Travis Hendricks, Travis Koh, MD for primary care. I spoke with  Travis Hendricks by phone today  What matters to the patients health and wellness today?  Health Maintenance    SDOH assessments and interventions completed:  Yes  SDOH Interventions Today    Flowsheet Row Most Recent Value  SDOH Interventions   Food Insecurity Interventions Intervention Not Indicated  Transportation Interventions Intervention Not Indicated        Care Coordination Interventions Activated:  No  Care Coordination Interventions:  No, not indicated   Follow up plan:  Travis Hendricks will call for care coordination assistance as needed.    Encounter Outcome:  Pt. Visit Completed   [facility] Care Management [phone]

## 2022-03-04 DIAGNOSIS — Z23 Encounter for immunization: Secondary | ICD-10-CM | POA: Diagnosis not present

## 2022-04-06 DIAGNOSIS — R310 Gross hematuria: Secondary | ICD-10-CM | POA: Diagnosis not present

## 2022-04-06 DIAGNOSIS — N401 Enlarged prostate with lower urinary tract symptoms: Secondary | ICD-10-CM | POA: Diagnosis not present

## 2022-04-06 DIAGNOSIS — Z8546 Personal history of malignant neoplasm of prostate: Secondary | ICD-10-CM | POA: Diagnosis not present

## 2022-04-06 DIAGNOSIS — R351 Nocturia: Secondary | ICD-10-CM | POA: Diagnosis not present

## 2022-04-06 DIAGNOSIS — N5201 Erectile dysfunction due to arterial insufficiency: Secondary | ICD-10-CM | POA: Diagnosis not present

## 2022-04-06 DIAGNOSIS — C61 Malignant neoplasm of prostate: Secondary | ICD-10-CM | POA: Diagnosis not present

## 2022-04-12 IMAGING — MR MR LUMBAR SPINE W/O CM
4 of 5 series · 27 of 48 positions shown · non-contrast
Comparison: MRI 08/07/2019

CLINICAL DATA: Low back pain with right-sided radiculopathy for 6
weeks

EXAM:
MRI LUMBAR SPINE WITHOUT CONTRAST
TECHNIQUE: Multiplanar, multisequence MR imaging of the lumbar spine was
performed. No intravenous contrast was administered.

[Series 3: T2 · sagittal · 4.0mm · 1.09mm/px · 6 of 17 slices shown (1 of 2)]
[im 1/17]
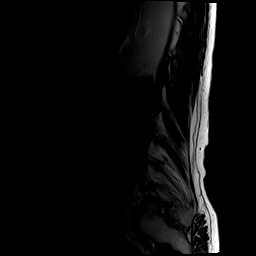
[im 4/17]
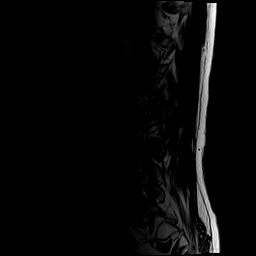
[im 7/17]
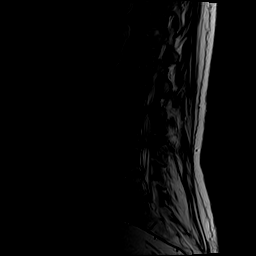
[im 10/17]
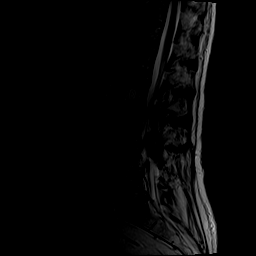
[im 13/17]
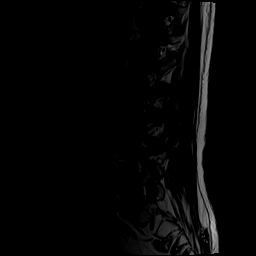
[im 17/17]
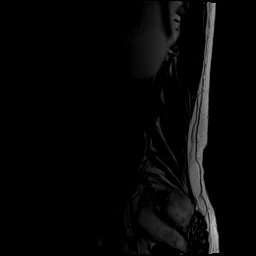

[Series 5: T1 · sagittal · 4.0mm · 1.09mm/px · 6 of 17 slices shown (1 of 2)]
[im 1/17]
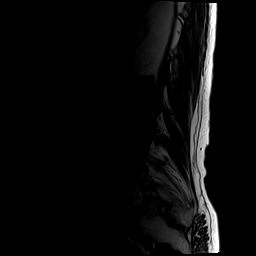
[im 4/17]
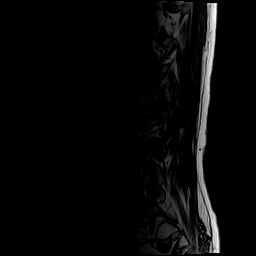
[im 7/17]
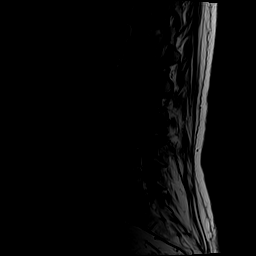
[im 10/17]
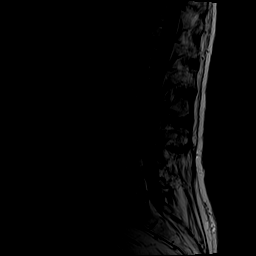
[im 13/17]
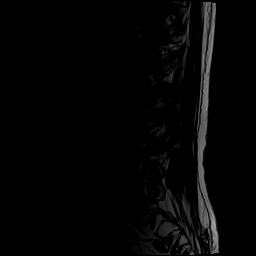
[im 17/17]
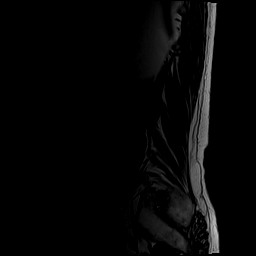

[Series 6: T2 · axial · 4.0mm · 0.39mm/px · z∈[-70,+145]mm · 9 of 42 slices shown (2 of 2)]
[im 1/42]
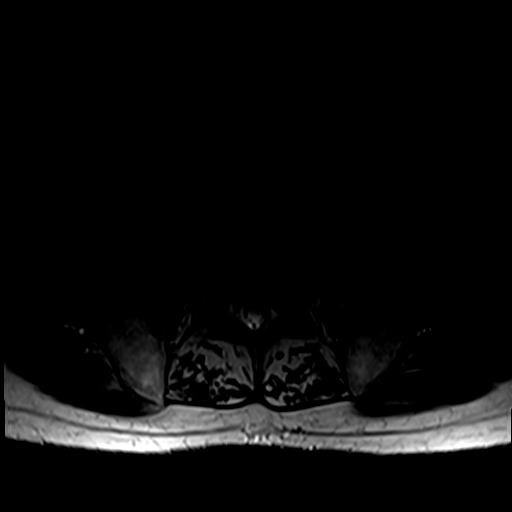
[im 6/42]
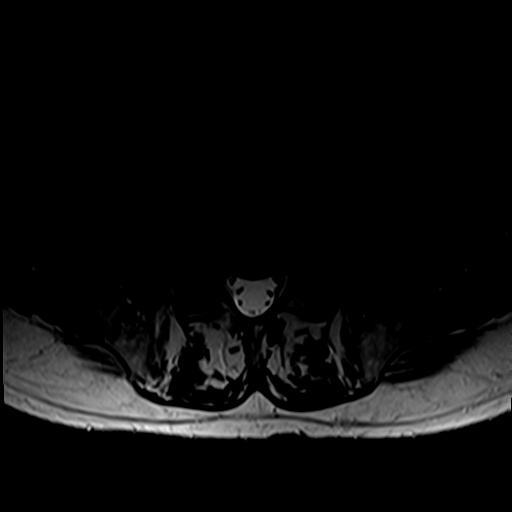
[im 12/42]
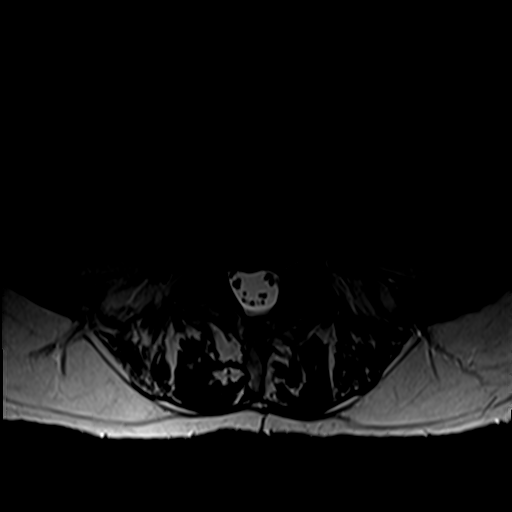
[im 18/42]
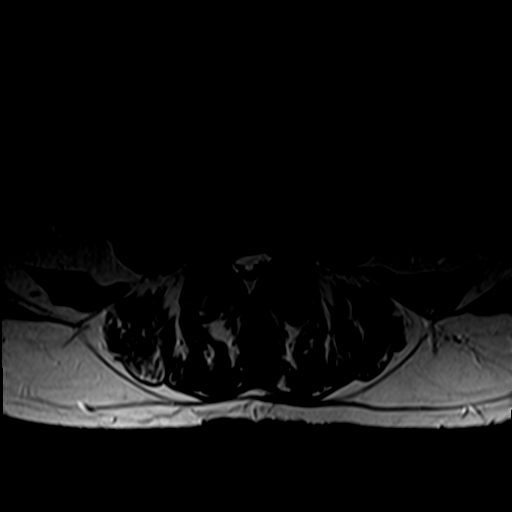
[im 21/42]
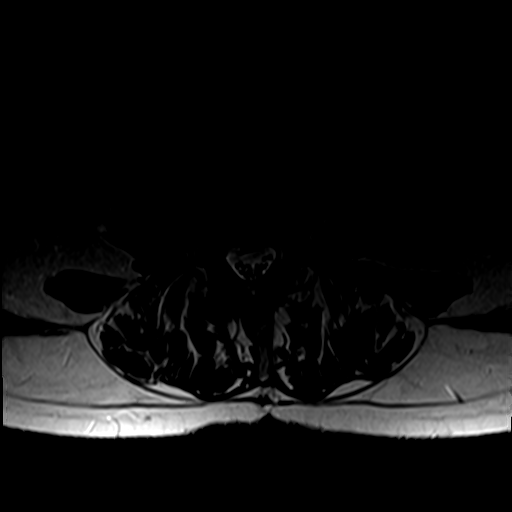
[im 24/42]
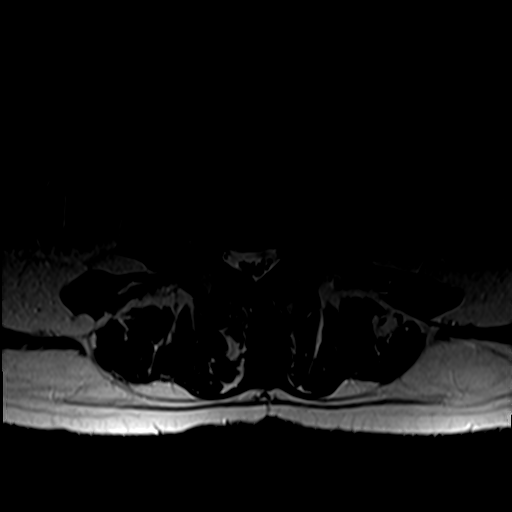
[im 30/42]
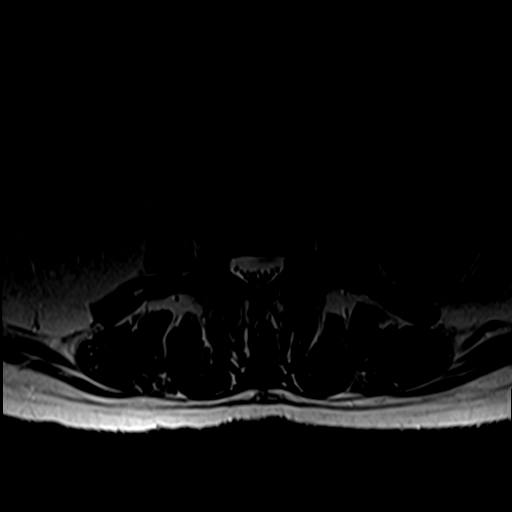
[im 36/42]
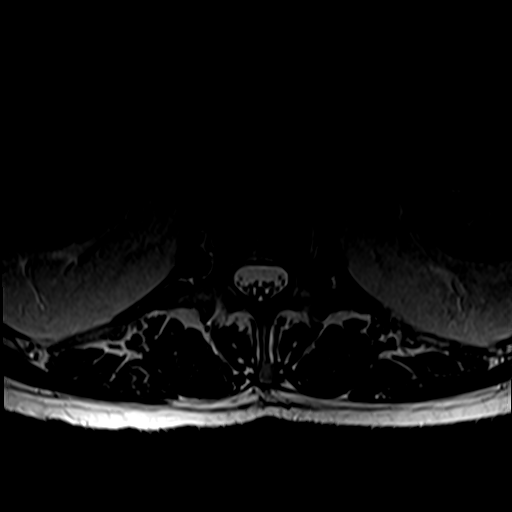
[im 42/42]
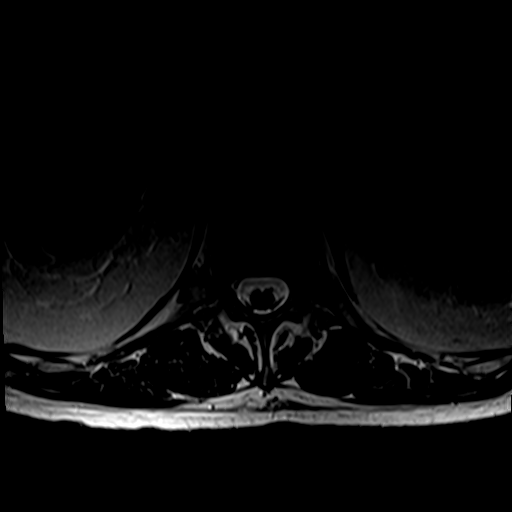

[Series 7: T1 · axial · 4.0mm · 0.39mm/px · z∈[-70,+116]mm · 6 of 42 slices shown (2 of 2)]
[im 1/42]
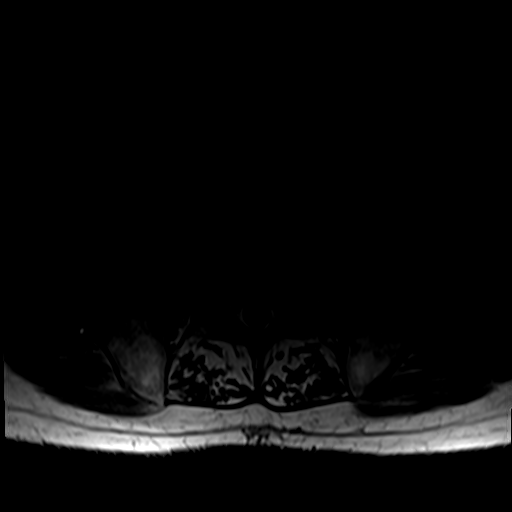
[im 6/42]
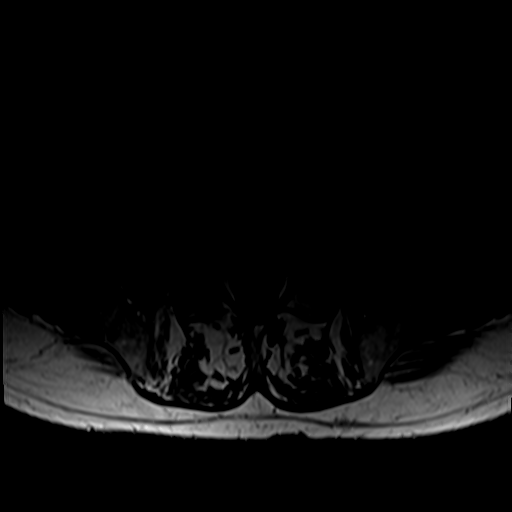
[im 12/42]
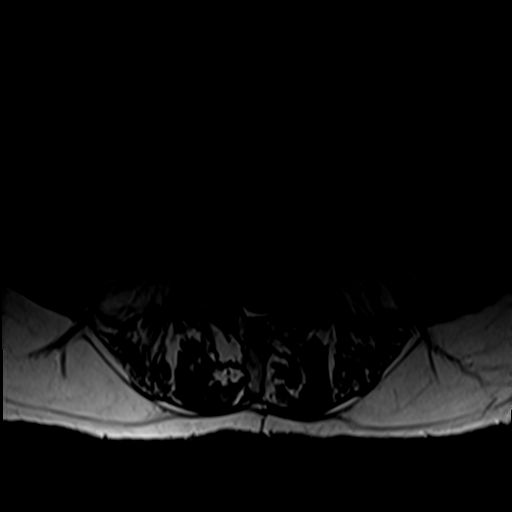
[im 18/42]
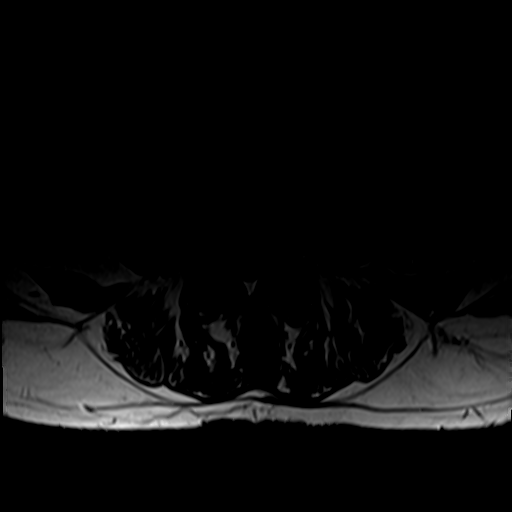
[im 21/42]
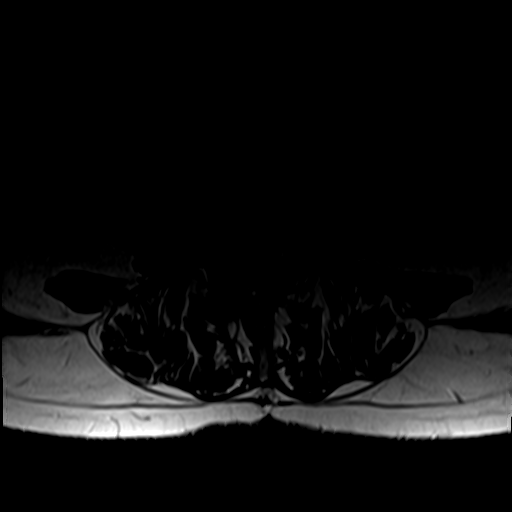
[im 36/42]
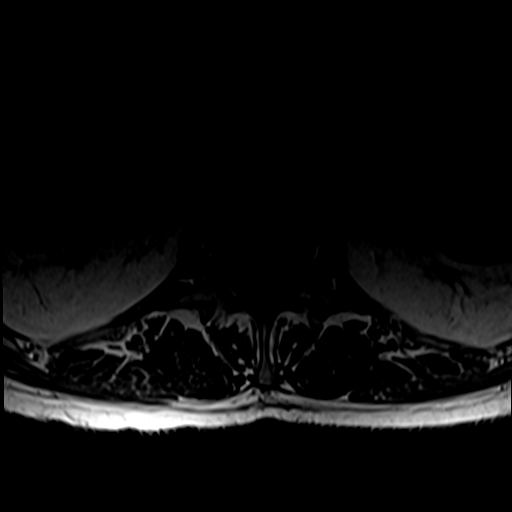

[27 of 48 positions shown; findings below may reference images not displayed]

FINDINGS: Segmentation:  Standard.

Alignment:  Physiologic.

Vertebrae: No fracture, evidence of discitis, or suspicious bone
lesion. Scattered benign intraosseous hemangiomas. Chronic
discogenic endplate marrow changes of L4-5.

Conus medullaris and cauda equina: Conus extends to the L1 level.
Conus and cauda equina appear normal.

Paraspinal and other soft tissues: Negative.

Disc levels:

T12-L1: Unremarkable.

L1-L2: Minimal disc bulge with posterior annular fissure. Facet
joints within normal limits. Borderline-mild right foraminal
stenosis. No canal stenosis. Unchanged.

L2-L3: Broad-based disc bulge with superimposed new central disc
herniation with 11 mm of cranial extension. Mild bilateral facet
arthropathy and ligamentum flavum buckling. Findings contribute to
moderate canal stenosis. No foraminal stenosis. Findings have
worsened from prior.

L3-L4: Broad-based disc bulge with right greater than left
biforaminal protrusions, slightly progressed. Bilateral facet
arthropathy and ligamentum flavum buckling. Findings result in
moderate right and mild left subarticular recess stenosis without
significant canal stenosis. Borderline-mild bilateral foraminal
stenosis. Findings progressed from prior.

L4-L5: Prior laminectomy changes. Chronic disc height loss with mild
endplate ridging. Mild bilateral facet arthropathy. Mild-moderate
left and mild right foraminal stenosis. No canal stenosis.
Unchanged.

L5-S1: Disc height loss with mild endplate ridging. Mild bilateral
facet arthropathy. Mild right-sided foraminal stenosis. No canal
stenosis. Unchanged.
IMPRESSION: 1. Multilevel lumbar spondylosis has progressed at L2-L3 and L3-L4.
2. At L2-3, there is a new central disc herniation with 11 mm of
cranial extension. Resultant moderate canal stenosis at this level.
3. Mild-moderate left and mild right foraminal stenosis at L4-5.

## 2022-05-16 DIAGNOSIS — Z125 Encounter for screening for malignant neoplasm of prostate: Secondary | ICD-10-CM | POA: Diagnosis not present

## 2022-05-16 DIAGNOSIS — E7800 Pure hypercholesterolemia, unspecified: Secondary | ICD-10-CM | POA: Diagnosis not present

## 2022-05-31 DIAGNOSIS — Z1339 Encounter for screening examination for other mental health and behavioral disorders: Secondary | ICD-10-CM | POA: Diagnosis not present

## 2022-05-31 DIAGNOSIS — M179 Osteoarthritis of knee, unspecified: Secondary | ICD-10-CM | POA: Diagnosis not present

## 2022-05-31 DIAGNOSIS — Z0000 Encounter for general adult medical examination without abnormal findings: Secondary | ICD-10-CM | POA: Diagnosis not present

## 2022-05-31 DIAGNOSIS — M479 Spondylosis, unspecified: Secondary | ICD-10-CM | POA: Diagnosis not present

## 2022-05-31 DIAGNOSIS — Z1331 Encounter for screening for depression: Secondary | ICD-10-CM | POA: Diagnosis not present

## 2022-05-31 DIAGNOSIS — C61 Malignant neoplasm of prostate: Secondary | ICD-10-CM | POA: Diagnosis not present

## 2022-05-31 DIAGNOSIS — N5239 Other post-surgical erectile dysfunction: Secondary | ICD-10-CM | POA: Diagnosis not present

## 2022-05-31 DIAGNOSIS — E7800 Pure hypercholesterolemia, unspecified: Secondary | ICD-10-CM | POA: Diagnosis not present

## 2022-05-31 DIAGNOSIS — R82998 Other abnormal findings in urine: Secondary | ICD-10-CM | POA: Diagnosis not present

## 2022-10-17 ENCOUNTER — Other Ambulatory Visit (INDEPENDENT_AMBULATORY_CARE_PROVIDER_SITE_OTHER): Payer: PRIVATE HEALTH INSURANCE

## 2022-10-17 ENCOUNTER — Ambulatory Visit (INDEPENDENT_AMBULATORY_CARE_PROVIDER_SITE_OTHER): Payer: Medicare HMO | Admitting: Orthopedic Surgery

## 2022-10-17 ENCOUNTER — Encounter: Payer: Self-pay | Admitting: Orthopedic Surgery

## 2022-10-17 DIAGNOSIS — M25532 Pain in left wrist: Secondary | ICD-10-CM

## 2022-10-17 NOTE — Progress Notes (Signed)
Office Visit Note   Patient: Travis Hendricks           Date of Birth: 1941-05-21           MRN: [id number] Visit Date: 10/17/2022              Requested by: Gaspar Garbe, MD [address] [place],  [place] [address] PCP: Gaspar Garbe, MD  Chief Complaint  Patient presents with   Left Wrist - Pain      HPI: Patient is an 82 year old gentleman who was playing pickle ball when he fell on an outstretched left hand.  Patient complains of pain and swelling and bruising over the wrist and hand.  Assessment & Plan: Visit Diagnoses:  1. Pain in left wrist     Plan: Continue with the Velcro splint no restrictions.  He may use Voltaren gel over the TFCC.  Follow-Up Instructions: Return if symptoms worsen or fail to improve.   Ortho Exam  Patient is alert, oriented, no adenopathy, well-dressed, normal affect, normal respiratory effort. Examination the scaphoid and scapholunate are nontender to palpation.  Patient is point tender to palpation over the TFCC.  There are no fractures no dislocations no joint space abnormalities.  Patient does have swelling over the dorsum of his hand and symptoms seem to all be consistent with trauma to the TFCC.  Imaging: XR Wrist Complete Left  Result Date: 10/17/2022 Three-view radiographs of the left wrist shows a normal scaphoid.  There is no scapholunate widening.  There is no ulnar positive variance.  No fractures.  No images are attached to the encounter.  Labs: Lab Results  Component Value Date   HGBA1C 5.7 06/28/2011   HGBA1C 5.9 06/24/2008     Lab Results  Component Value Date   ALBUMIN 3.8 06/28/2011   ALBUMIN 3.6 06/18/2010   ALBUMIN 3.7 06/01/2009    No results found for: "MG" No results found for: "VD25OH"  No results found for: "PREALBUMIN"    Latest Ref Rng & Units 06/28/2011    9:31 AM 06/18/2010    7:58 AM 06/01/2009   10:32 AM  CBC EXTENDED  WBC 4.5 - 10.5 K/uL 4.6  5.8  5.6   RBC 4.22 - 5.81 Mil/uL  4.65  4.82  4.79   Hemoglobin 13.0 - 17.0 g/dL 65.7  84.6  96.2   HCT 39.0 - 52.0 % 44.4  45.6  45.6   Platelets 150.0 - 400.0 K/uL 235.0  236.0  239.0   NEUT# 1.4 - 7.7 K/uL 2.0  2.4  2.5   Lymph# 0.7 - 4.0 K/uL 1.9  2.5  2.1      There is no height or weight on file to calculate BMI.  Orders:  Orders Placed This Encounter  Procedures   XR Wrist Complete Left   No orders of the defined types were placed in this encounter.    Procedures: No procedures performed  Clinical Data: No additional findings.  ROS:  All other systems negative, except as noted in the HPI. Review of Systems  Objective: Vital Signs: There were no vitals taken for this visit.  Specialty Comments:  No specialty comments available.  PMFS History: Patient Active Problem List   Diagnosis Date Noted   Chronic kidney disease, stage 3 unspecified (HCC) 09/14/2020   Cough 09/14/2020   Diarrhea 09/14/2020   ED (erectile dysfunction) of organic origin 09/14/2020   Elevated blood-pressure reading without diagnosis of hypertension 09/14/2020   Elevated PSA 09/14/2020  Encounter for general adult medical examination without abnormal findings 09/14/2020   Encounter for screening for other disorder 09/14/2020   Encounter for immunization 09/14/2020   Encounter for general adult medical examination with abnormal findings 09/14/2020   Infectious colitis, enteritis and gastroenteritis 09/14/2020   Left lower quadrant pain 09/14/2020   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/14/2020   Other long term (current) drug therapy 09/14/2020   Periumbilical abdominal pain 09/14/2020   Polymyalgia rheumatica (HCC) 09/14/2020   Primary gout 09/14/2020   Malignant neoplasm of prostate (HCC) 09/14/2020   Lumbar radiculopathy 09/14/2020   Vitamin D deficiency 09/14/2020   Weight loss 09/14/2020   Body mass index (BMI) 25.0-25.9, adult 01/17/2020   Essential (primary) hypertension 01/17/2020   Other chronic  pain 01/17/2020   Spondylosis 01/17/2020   Osteoarthritis of left hip 11/29/2017   Osteoarthritis of right hip 11/29/2017   Basal cell carcinoma 11/25/2015   VENOUS INSUFFICIENCY, MILD 12/23/2008   DIVERTICULOSIS, COLON 06/09/2008   LOW BACK PAIN, CHRONIC 06/09/2008   HYPERLIPIDEMIA 07/21/2006   GILBERT'S SYNDROME 07/21/2006   Past Medical History:  Diagnosis Date   Basal cell carcinoma    Diverticulosis    Other and unspecified hyperlipidemia    Prostate cancer (HCC)     Family History  Problem Relation Age of Onset   Heart attack Father    Lung cancer Mother    Prostate cancer Brother    Colon cancer Neg Hx    Pancreatic cancer Neg Hx     Past Surgical History:  Procedure Laterality Date   CATARACT EXTRACTION W/ INTRAOCULAR LENS IMPLANT     bilaterally   COLONOSCOPY     Tics   ELBOW SURGERY     torn ligament   HERNIA REPAIR     KNEE ARTHROSCOPY     VASECTOMY     Social History   Occupational History    Comment: retired  Tobacco Use   Smoking status: Never   Smokeless tobacco: Never  Vaping Use   Vaping Use: Never used  Substance and Sexual Activity   Alcohol use: Yes    Alcohol/week: 14.0 standard drinks of alcohol    Types: 7 Glasses of wine, 7 Shots of liquor per week   Drug use: No   Sexual activity: Yes

## 2022-12-18 ENCOUNTER — Other Ambulatory Visit: Payer: Self-pay

## 2022-12-18 ENCOUNTER — Encounter (HOSPITAL_BASED_OUTPATIENT_CLINIC_OR_DEPARTMENT_OTHER): Payer: Self-pay | Admitting: Emergency Medicine

## 2022-12-18 ENCOUNTER — Emergency Department (HOSPITAL_BASED_OUTPATIENT_CLINIC_OR_DEPARTMENT_OTHER)
Admission: EM | Admit: 2022-12-18 | Discharge: 2022-12-18 | Disposition: A | Discharge status: Home / Self Care | Payer: Medicare HMO | Attending: Emergency Medicine | Admitting: Emergency Medicine

## 2022-12-18 ENCOUNTER — Telehealth: Payer: Self-pay | Admitting: Physician Assistant

## 2022-12-18 ENCOUNTER — Emergency Department (HOSPITAL_BASED_OUTPATIENT_CLINIC_OR_DEPARTMENT_OTHER): Payer: Medicare HMO

## 2022-12-18 DIAGNOSIS — Z7901 Long term (current) use of anticoagulants: Secondary | ICD-10-CM | POA: Insufficient documentation

## 2022-12-18 DIAGNOSIS — I10 Essential (primary) hypertension: Secondary | ICD-10-CM | POA: Diagnosis not present

## 2022-12-18 DIAGNOSIS — Z79899 Other long term (current) drug therapy: Secondary | ICD-10-CM | POA: Diagnosis not present

## 2022-12-18 DIAGNOSIS — I4891 Unspecified atrial fibrillation: Secondary | ICD-10-CM | POA: Diagnosis not present

## 2022-12-18 DIAGNOSIS — I2510 Atherosclerotic heart disease of native coronary artery without angina pectoris: Secondary | ICD-10-CM | POA: Diagnosis not present

## 2022-12-18 DIAGNOSIS — R42 Dizziness and giddiness: Secondary | ICD-10-CM | POA: Diagnosis present

## 2022-12-18 HISTORY — DX: Essential (primary) hypertension: I10

## 2022-12-18 LAB — CBC
HCT: 40.1 % (ref 39.0–52.0)
Hemoglobin: 13.5 g/dL (ref 13.0–17.0)
MCH: 31.1 pg (ref 26.0–34.0)
MCHC: 33.7 g/dL (ref 30.0–36.0)
MCV: 92.4 fL (ref 80.0–100.0)
Platelets: 175 10*3/uL (ref 150–400)
RBC: 4.34 MIL/uL (ref 4.22–5.81)
RDW: 12.8 % (ref 11.5–15.5)
WBC: 5.3 10*3/uL (ref 4.0–10.5)
nRBC: 0 % (ref 0.0–0.2)

## 2022-12-18 LAB — TSH: TSH: 2.676 u[IU]/mL (ref 0.350–4.500)

## 2022-12-18 LAB — BASIC METABOLIC PANEL
Anion gap: 11 (ref 5–15)
BUN: 17 mg/dL (ref 8–23)
CO2: 24 mmol/L (ref 22–32)
Calcium: 9.2 mg/dL (ref 8.9–10.3)
Chloride: 106 mmol/L (ref 98–111)
Creatinine, Ser: 1.42 mg/dL — ABNORMAL HIGH (ref 0.61–1.24)
GFR, Estimated: 50 mL/min — ABNORMAL LOW (ref >60)
Glucose, Bld: 94 mg/dL (ref 70–99)
Potassium: 4.5 mmol/L (ref 3.5–5.1)
Sodium: 141 mmol/L (ref 135–145)

## 2022-12-18 LAB — MAGNESIUM: Magnesium: 2 mg/dL (ref 1.7–2.4)

## 2022-12-18 MED ORDER — METOPROLOL TARTRATE 25 MG PO TABS
25.0000 mg | ORAL_TABLET | Freq: Once | ORAL | Status: AC
  Administered 2022-12-18: 25 mg via ORAL
  Filled 2022-12-18: qty 1

## 2022-12-18 MED ORDER — METOPROLOL SUCCINATE ER 25 MG PO TB24
25.0000 mg | ORAL_TABLET | Freq: Every day | ORAL | Status: DC
  Administered 2022-12-18: 25 mg via ORAL
  Filled 2022-12-18: qty 1

## 2022-12-18 MED ORDER — METOPROLOL SUCCINATE ER 25 MG PO TB24: 25.0000 mg | ORAL_TABLET | Freq: Every day | ORAL | 0 refills | Status: DC

## 2022-12-18 MED ORDER — METOPROLOL TARTRATE 5 MG/5ML IV SOLN
5.0000 mg | Freq: Once | INTRAVENOUS | Status: DC
  Filled 2022-12-18: qty 5

## 2022-12-18 MED ORDER — APIXABAN 2.5 MG PO TABS
5.0000 mg | ORAL_TABLET | Freq: Two times a day (BID) | ORAL | Status: DC
  Administered 2022-12-18: 5 mg via ORAL
  Filled 2022-12-18: qty 2

## 2022-12-18 MED ORDER — LACTATED RINGERS IV BOLUS
500.0000 mL | Freq: Once | INTRAVENOUS | Status: AC
  Administered 2022-12-18: 500 mL via INTRAVENOUS

## 2022-12-18 MED ORDER — APIXABAN 5 MG PO TABS: 5.0000 mg | ORAL_TABLET | Freq: Two times a day (BID) | ORAL | 0 refills | Status: DC

## 2022-12-18 NOTE — Discharge Instructions (Addendum)
You are seen in the emergency room for low blood pressure, elevated heart rate.  As discussed, you have new onset of a cardiac arrhythmia called atrial fibrillation.  Read instructions provided on this condition.  To reduce the risk of stroke that comes with atrial fibrillation, we have started you on blood thinner called Eliquis.  Read the instructions on Eliquis that is also provided.  To ensure that your heart rate remains within normal range, we have prescribed you with a medication called metoprolol extended release.   Please take it easy for the next 2 days with physical activities, as your body will take some time adjusting to these medications.  Return to the emergency room immediately if you start having severe chest pain, dizziness, near fainting or fainting spell, severe shortness of breath.   ____________________________________________________________________________________________________________________________ Information on my medicine - ELIQUIS (apixaban)  This medication education was reviewed with me or my healthcare representative as part of my discharge preparation.  Why was Eliquis prescribed for you? Eliquis was prescribed for you to reduce the risk of a blood clot forming that can cause a stroke if you have a medical condition called atrial fibrillation (a type of irregular heartbeat).  What do You need to know about Eliquis ? Take your Eliquis TWICE DAILY - one tablet in the morning and one tablet in the evening with or without food. If you have difficulty swallowing the tablet whole please discuss with your pharmacist how to take the medication safely.  Take Eliquis exactly as prescribed by your doctor and DO NOT stop taking Eliquis without talking to the doctor who prescribed the medication.  Stopping may increase your risk of developing a stroke.  Refill your prescription before you run out.  After discharge, you should have regular check-up appointments  with your healthcare provider that is prescribing your Eliquis.  In the future your dose may need to be changed if your kidney function or weight changes by a significant amount or as you get older.  What do you do if you miss a dose? If you miss a dose, take it as soon as you remember on the same day and resume taking twice daily.  Do not take more than one dose of ELIQUIS at the same time to make up a missed dose.  Important Safety Information A possible side effect of Eliquis is bleeding. You should call your healthcare provider right away if you experience any of the following: Bleeding from an injury or your nose that does not stop. Unusual colored urine (red or dark brown) or unusual colored stools (red or black). Unusual bruising for unknown reasons. A serious fall or if you hit your head (even if there is no bleeding).  Some medicines may interact with Eliquis and might increase your risk of bleeding or clotting while on Eliquis. To help avoid this, consult your healthcare provider or pharmacist prior to using any new prescription or non-prescription medications, including herbals, vitamins, non-steroidal anti-inflammatory drugs (NSAIDs) and supplements.  This website has more information on Eliquis (apixaban): http://www.eliquis.com/eliquis/home

## 2022-12-18 NOTE — ED Triage Notes (Signed)
Pt has been having erratic Bp this week ranging from 160's to 130's. He states his normal bp runs 120 to 130 over 70's. He states his heart rate resting is in the 40's. He woke up today and felt dizzy, not well, bp was 60/40. He does note on Thursday night had an episode of sudden vomiting and resolved.pt is in afib in triage 90-120,does not feel his heart rate increasing.

## 2022-12-18 NOTE — ED Provider Notes (Addendum)
[place] EMERGENCY DEPARTMENT AT [facility] Provider Note   CSN: [id number] Arrival date & time: 12/18/22  4098     History  No chief complaint on file.   Travis Hendricks is a 82 y.o. male.  HPI     82 year old male comes in with chief complaint of low blood pressure.  Patient has history of hypertension, hyperlipidemia.  He indicates that he woke up this morning, decided to take his dog on a walk and started feeling dizziness.  When he checked his blood pressure, the blood pressure was in the 60s systolic.  He also noted that his heart rate was running fast.  In general patient's resting heart rate is in the 40s.  He states that he is very active, plays pickle ball every day.  He started monitoring his blood pressure more recently, as his smart watch would frequently tell him that his heart rate was in the 30s.  He was not symptomatic with those episodes.  He has not experienced any palpitations.  Patient has known coronary artery disease.  He denies any chest pain, shortness of breath at this time.  He went to [place] in April -but they flew business class.  Home Medications Prior to Admission medications   Medication Sig Start Date End Date Taking? Authorizing Provider  apixaban (ELIQUIS) 5 MG TABS tablet Take 1 tablet (5 mg total) by mouth 2 (two) times daily. 12/18/22 01/17/23 Yes Derwood Kaplan, MD  metoprolol succinate (TOPROL-XL) 25 MG 24 hr tablet Take 1 tablet (25 mg total) by mouth daily. 12/18/22  Yes Derwood Kaplan, MD  Ascorbic Acid (VITAMIN C) 1000 MG tablet Take 1,000 mg by mouth daily.    [provider]  cholecalciferol (VITAMIN D3) 25 MCG (1000 UNIT) tablet 1 tablet    [provider]  CRESTOR 20 MG tablet TAKE AS DIRECTED-LABS DUE 07/09/2012 Patient taking differently: 10 mg. 08/20/12   Pecola Lawless, MD  Glucosamine-Chondroit-Vit C-Mn (GLUCOSAMINE CHONDR 1500 COMPLX PO) 1 capsule    [provider]  Multiple Vitamin  (MULTIVITAMIN ADULT PO) multivitamin    [provider]  Omega-3 Fatty Acids (FISH OIL) 1000 MG CAPS Fish Oil    [provider]  tadalafil (CIALIS) 5 MG tablet Take 5 mg by mouth daily.    [provider]  traMADol (ULTRAM) 50 MG tablet Take 1 tablet (50 mg total) by mouth every 8 (eight) hours as needed for moderate pain or severe pain. 06/25/21   Juanda Chance, NP  TURMERIC PO turmeric    [provider]  vitamin E 45 MG (100 UNITS) capsule 1 capsule    [provider]      Allergies    No known allergies    Review of Systems   Review of Systems  All other systems reviewed and are negative.   Physical Exam Updated Vital Signs BP 110/80   Pulse (!) 116   Temp 97.7 F (36.5 C) (Oral)   Resp 17   Wt 74.8 kg   SpO2 100%   BMI 25.09 kg/m  Physical Exam Vitals and nursing note reviewed.  Constitutional:      Appearance: He is well-developed.  HENT:     Head: Atraumatic.  Eyes:     Extraocular Movements: Extraocular movements intact.     Pupils: Pupils are equal, round, and reactive to light.  Cardiovascular:     Rate and Rhythm: Tachycardia present. Rhythm irregular.  Pulmonary:     Effort: Pulmonary  effort is normal.  Musculoskeletal:     Cervical back: Neck supple.     Right lower leg: No edema.     Left lower leg: No edema.  Skin:    General: Skin is warm.  Neurological:     Mental Status: He is alert and oriented to person, place, and time.     ED Results / Procedures / Treatments   Labs (all labs ordered are listed, but only abnormal results are displayed) Labs Reviewed  BASIC METABOLIC PANEL - Abnormal; Notable for the following components:      Result Value   Creatinine, Ser 1.42 (*)    GFR, Estimated 50 (*)    All other components within normal limits  MAGNESIUM  CBC  TSH    EKG EKG Interpretation Date/Time:  Sunday December 18 2022 07:17:35 EDT Ventricular Rate:  115 PR Interval:    QRS  Duration:  134 QT Interval:  372 QTC Calculation: 515 R Axis:   58  Text Interpretation: Atrial flutter Right bundle branch block afib is new today Confirmed by Derwood Kaplan ([id number]) on 12/18/2022 7:38:34 AM  Radiology DG Chest Port 1 View  Result Date: 12/18/2022 CLINICAL DATA:  new AFib EXAM: PORTABLE CHEST 1 VIEW COMPARISON:  06/22/2011 FINDINGS: The heart size and mediastinal contours are within normal limits. Both lungs are clear. The visualized skeletal structures are unremarkable. IMPRESSION: No active disease. Electronically Signed   By: Charlett Nose M.D.   On: 12/18/2022 09:08    Procedures .Critical Care  Performed by: Derwood Kaplan, MD Authorized by: Derwood Kaplan, MD   Critical care provider statement:    Critical care time (minutes):  51   Critical care was necessary to treat or prevent imminent or life-threatening deterioration of the following conditions:  Circulatory failure   Critical care was time spent personally by me on the following activities:  Development of treatment plan with patient or surrogate, discussions with consultants, evaluation of patient's response to treatment, examination of patient, ordering and review of laboratory studies, ordering and review of radiographic studies, ordering and performing treatments and interventions, pulse oximetry, re-evaluation of patient's condition and review of old charts     Medications Ordered in ED Medications  apixaban (ELIQUIS) tablet 5 mg (has no administration in time range)  metoprolol succinate (TOPROL-XL) 24 hr tablet 25 mg (has no administration in time range)  lactated ringers bolus 500 mL (500 mLs Intravenous New Bag/Given 12/18/22 0831)  metoprolol tartrate (LOPRESSOR) tablet 25 mg (25 mg Oral Given 12/18/22 0831)    ED Course/ Medical Decision Making/ A&P         CHA2DS2-VASc Score: 3                    Medical Decision Making Problems Addressed: Atrial fibrillation with RVR (HCC): acute  illness or injury New onset a-fib (HCC): acute illness or injury with systemic symptoms  Amount and/or Complexity of Data Reviewed Labs: ordered. Radiology: ordered.  Risk Prescription drug management. Decision regarding hospitalization. Risk Details: Will need urgent follow up. Hemodynamic risks present. High risk for return ed visit. Close follow up arranged  Critical Care Total time providing critical care: 70 minutes   82 year old male comes in with chief complaint of low blood pressure, elevated heart rate.  He has history of hypertension and hyperlipidemia.  Patient noted to be in A-fib with RVR.  Blood pressures in the 90s systolic.  At rest, is not symptomatic, but with any activity he becomes  symptomatic.  Differential diagnosis for him includes A-fib secondary to ACS, pulmonary embolism, electrolyte abnormality, structural heart disease.  Patient does not appear to be decompensated at this time, no evidence of volume overload.  EKG reveals heart rate in the 120s.  Initially ordered IV metoprolol to see if we can get heart rate better controlled with it, however patient's blood pressure dropped into 80s one more time.  Plan is to give him IV fluids and give him oral metoprolol once BP is better.  EKG does show right bundle branch block,.  I do not have previous EKG more recently to compare it with.  Although patient went to [place], he indicates that he flew business class and was laying flat.  Until this point today, he has not had any issues with exertional activity and is very active.  I do not think a CT PE is needed at this time.  Reassessment 1: Patient blood pressure has improved with IV fluids.  I have advised nursing staff to continue with oral metoprolol.  Reassessment 2:  Around 930 AM: Patient's heart rate now in the 70s.  Blood pressure has sustained over 100.  Reassessment 3  Heart rate remains in the 60s.  Blood pressure still stable.  I consulted  cardiology and spoke with Dr. Antoine Poche. Question was pertaining to rate control if he is discharged. Dr. Antoine Poche recommends that we give him low-dose Toprol 25 mg XL daily.  Cardiology team will get him into the cardiac practice within 2 to 3 days.  I discussed this recommendation with the patient.  He is comfortable with the plan of follow-up with cardiology in the outpatient setting.  Advised him to buy a pulse ox monitor that can more accurately capture heart rate.  Patient has an Apple Watch and blood pressure cuff that are able to monitor heart rate as well.  We discussed Eliquis and the potential effects of catastrophic bleed with trauma or increased bleeding if there were to be lower GI bleed or hematuria.  We discussed the benefit of Eliquis and patient is comfortable with starting the prescription now.  Final Clinical Impression(s) / ED Diagnoses Final diagnoses:  New onset a-fib (HCC)  Atrial fibrillation with RVR (HCC)    Rx / DC Orders ED Discharge Orders          Ordered    Amb referral to AFIB Clinic        12/18/22 0753    metoprolol succinate (TOPROL-XL) 25 MG 24 hr tablet  Daily        12/18/22 1033    apixaban (ELIQUIS) 5 MG TABS tablet  2 times daily        12/18/22 1033    Ambulatory referral to Cardiology       Comments: If you have not heard from the Cardiology office within the next 72 hours please call [phone].   12/18/22 1035              Derwood Kaplan, MD 12/18/22 1035    Derwood Kaplan, MD 12/18/22 1055

## 2022-12-18 NOTE — Telephone Encounter (Signed)
   Dr. Antoine Poche covering this weekend relayed the following msg: "ED is putting in a fib clinic referral for new onset. needs to be seen in the next couple of days but will be a new patient. either afib clinic or office whomever can see in the next couple of days." Will route msg to afib clinic nurse Ventura Sellers to see if they are able to accommodate and call pt with appt info. Stacy, if unable to be scheduled in afib clinic, please let [facility] scheduling team know to prioritize early new patient f/u in general cardiology clinic (new). Thank you!

## 2022-12-18 NOTE — Progress Notes (Signed)
ANTICOAGULATION CONSULT NOTE - Initial Consult  Pharmacy Consult for apixaban  Indication: atrial fibrillation  Allergies  Allergen Reactions   No Known Allergies     Patient Measurements: Weight: 74.8 kg (165 lb)  Vital Signs: Temp: 97.7 F (36.5 C) (06/30 0722) Temp Source: Oral (06/30 0722) BP: 110/80 (06/30 0745) Pulse Rate: 116 (06/30 0745)  Labs: Recent Labs    12/18/22 0737  HGB 13.5  HCT 40.1  PLT 175  CREATININE 1.42*    CrCl cannot be calculated (Unknown ideal weight.).   Medical History: Past Medical History:  Diagnosis Date   Basal cell carcinoma    Hypertension    Other and unspecified hyperlipidemia    Prostate cancer (HCC)     Medications:  (Not in a hospital admission)   Assessment: 82 yo M presents with symptomatic new-onset Afib. Patient was not taking any anticoagulation prior to admission. Pharmacy consulted to dose apixaban for new-onset Afib.   Hgb 13.5, Plt 175 No s/sx of bleeding  Goal of Therapy:  Monitor platelets by anticoagulation protocol: Yes   Plan:  Start apixaban 5mg  po BID  Monitor daily CBC, SCr, and for s/sx of bleeding Note: If SCr trends upward to 1.5 or higher, then apixaban dose should be decreased for age and renal function. Pharmacy to provide patient education on apixaban prior to discharge   Wilburn Cornelia, PharmD, BCPS Clinical Pharmacist 12/18/2022 10:11 AM   Please refer to [facility] for pharmacy phone number

## 2022-12-20 ENCOUNTER — Inpatient Hospital Stay (HOSPITAL_COMMUNITY)
Admission: RE | Admit: 2022-12-20 | Discharge: 2022-12-20 | Disposition: A | Discharge status: Home / Self Care | Payer: Medicare HMO | Source: Ambulatory Visit | Attending: Internal Medicine | Admitting: Internal Medicine

## 2022-12-20 ENCOUNTER — Ambulatory Visit (HOSPITAL_COMMUNITY)
Admission: RE | Admit: 2022-12-20 | Discharge: 2022-12-20 | Disposition: A | Discharge status: Home / Self Care | Payer: Medicare HMO | Source: Ambulatory Visit | Attending: Internal Medicine | Admitting: Internal Medicine

## 2022-12-20 VITALS — BP 150/70 | HR 48 | Ht 68.0 in | Wt 165.6 lb

## 2022-12-20 DIAGNOSIS — I4891 Unspecified atrial fibrillation: Secondary | ICD-10-CM

## 2022-12-20 DIAGNOSIS — Z7901 Long term (current) use of anticoagulants: Secondary | ICD-10-CM | POA: Insufficient documentation

## 2022-12-20 DIAGNOSIS — Z79899 Other long term (current) drug therapy: Secondary | ICD-10-CM | POA: Insufficient documentation

## 2022-12-20 DIAGNOSIS — I480 Paroxysmal atrial fibrillation: Secondary | ICD-10-CM | POA: Diagnosis not present

## 2022-12-20 DIAGNOSIS — D6869 Other thrombophilia: Secondary | ICD-10-CM | POA: Diagnosis not present

## 2022-12-20 DIAGNOSIS — I10 Essential (primary) hypertension: Secondary | ICD-10-CM | POA: Diagnosis not present

## 2022-12-20 MED ORDER — APIXABAN 5 MG PO TABS: 5.0000 mg | ORAL_TABLET | Freq: Two times a day (BID) | ORAL | 5 refills | Status: DC

## 2022-12-20 MED ORDER — METOPROLOL SUCCINATE ER 25 MG PO TB24: 12.5000 mg | ORAL_TABLET | Freq: Every day | ORAL | 5 refills | Status: DC

## 2022-12-20 NOTE — Patient Instructions (Signed)
Decrease metoprolol to 1/2 tablet once a day   

## 2022-12-20 NOTE — Progress Notes (Signed)
Primary Care Physician: Tisovec, Adelfa Koh, MD Primary Cardiologist: None Electrophysiologist: None     Referring Physician: ED at [facility] Travis Hendricks is a 82 y.o. male with a history of HTN, HLD, and paroxysmal atrial fibrillation who presents for consultation in the [facility]. The patient was initially diagnosed with atrial fibrillation during ED visit on 6/30. He felt dizzy while walking his dog and noted his BP to be in the 60s systolic and HR was fast. He was discharged on Toprol 25 mg daily. Patient notes he began monitoring his BP more recently as Apple Watch would tell him frequently his HR was in the 30s. Patient is on Eliquis 5 mg BID for a CHADS2VASC score of 3.  On evaluation today, patient is currently in NSR. He admits to being sick the week preceding ED evaluation and had emesis couple nights prior. He was not sure if had GI illness. He feels well currently and notes historically to have a lower HR at baseline. He plays pickleball frequently. Patient has an Apple Watch to monitor his rhythm. He intermittently snores at night per wife but it is not a lot and no stop breathing. He has 1 cup of coffee daily and has around 4-6 drinks on the weekend but no alcohol during the week.   Today, he denies symptoms of palpitations, chest pain, shortness of breath, orthopnea, PND, lower extremity edema, dizziness, presyncope, syncope, snoring, daytime somnolence, bleeding, or neurologic sequela. The patient is tolerating medications without difficulties and is otherwise without complaint today.    he has a BMI of Body mass index is 25.18 kg/m.Marland Kitchen Filed Weights   12/20/22 1340  Weight: 75.1 kg    Current Outpatient Medications  Medication Sig Dispense Refill   apixaban (ELIQUIS) 5 MG TABS tablet Take 1 tablet (5 mg total) by mouth 2 (two) times daily. 60 tablet 0   Ascorbic Acid (VITAMIN C PO) Take 1 tablet by mouth daily.     Ascorbic Acid  (VITAMIN C) 1000 MG tablet Take 1,000 mg by mouth daily.     BLACK CURRANT SEED OIL PO Take 1 tablet by mouth daily.     cholecalciferol (VITAMIN D3) 25 MCG (1000 UNIT) tablet Take 1,000 Units by mouth daily.     Glucosamine-Chondroitin 166.7-133.3 MG CAPS Take 2 tablets by mouth every morning.     losartan (COZAAR) 25 MG tablet Take 25 mg by mouth daily.     MAGNESIUM PO Take 1 tablet by mouth every morning.     metoprolol succinate (TOPROL-XL) 25 MG 24 hr tablet Take 1 tablet (25 mg total) by mouth daily. 10 tablet 0   Multiple Vitamin (MULTIVITAMIN ADULT PO) Take 1 tablet by mouth daily in the afternoon.     rosuvastatin (CRESTOR) 10 MG tablet Take 10 mg by mouth at bedtime.     tadalafil (CIALIS) 5 MG tablet Take 5 mg by mouth as needed.     traMADol (ULTRAM) 50 MG tablet Take 1 tablet (50 mg total) by mouth every 8 (eight) hours as needed for moderate pain or severe pain. 25 tablet 0   TURMERIC PO Take 1 tablet by mouth daily.     vitamin E 45 MG (100 UNITS) capsule 1 capsule     Omega-3 Fatty Acids (FISH OIL) 1000 MG CAPS Fish Oil (Patient not taking: Reported on 12/20/2022)     No current facility-administered medications for this encounter.    Atrial Fibrillation  Management history:  Previous antiarrhythmic drugs: None Previous cardioversions: None Previous ablations: None Anticoagulation history: Eliquis 5 mg BID   ROS- All systems are reviewed and negative except as per the HPI above.  Physical Exam: BP (!) 150/70   Pulse (!) 48   Ht 5\' 8"  (1.727 m)   Wt 75.1 kg   BMI 25.18 kg/m   GEN: Well nourished, well developed in no acute distress NECK: No JVD; No carotid bruits CARDIAC: Regular rate and rhythm, no murmurs, rubs, gallops RESPIRATORY:  Clear to auscultation without rales, wheezing or rhonchi  ABDOMEN: Soft, non-tender, non-distended EXTREMITIES:  No edema; No deformity   EKG today demonstrates  Vent. rate 48 BPM PR interval 196 ms QRS duration 134  ms QT/QTcB 488/435 ms P-R-T axes 64 49 43 Sinus bradycardia Right bundle branch block Abnormal ECG When compared with ECG of 18-Dec-2022 07:17, PREVIOUS ECG IS PRESENT  Echo N/A  ASSESSMENT & PLAN CHA2DS2-VASc Score = 3  The patient's score is based upon: CHF History: 0 HTN History: 1 Diabetes History: 0 Stroke History: 0 Vascular Disease History: 0 Age Score: 2 Gender Score: 0       ASSESSMENT AND PLAN: Paroxysmal Atrial Fibrillation (ICD10:  I48.0) The patient's CHA2DS2-VASc score is 3, indicating a 3.2% annual risk of stroke.    He is in NSR today. Education provided about Afib. Discussion about medication treatments and ablation if needed in the future. He does note 4 weeks prior to ED visit he has been having a lower HR than usual. He has noted multiple HR in the 30s. After discussion, we will proceed with cardiac monitor for 2 weeks and f/u in 4-6 weeks to discuss monitor results.  Take Toprol 1/2 tablet (12.5 mg) daily. He has Apple Watch to monitor rhythm.  Echo scheduled. No indication for sleep study at this point.    Secondary Hypercoagulable State (ICD10:  D68.69) The patient is at significant risk for stroke/thromboembolism based upon his CHA2DS2-VASc Score of 3.  Continue Apixaban (Eliquis).   No missed doses. Continue without interruption.    Follow up 4-6 weeks to discuss monitor results.   Lake Bells, PA-C  Afib Clinic [facility] [address] [place], [place] [address] [phone]

## 2022-12-21 ENCOUNTER — Other Ambulatory Visit (HOSPITAL_COMMUNITY): Payer: Self-pay | Admitting: *Deleted

## 2022-12-21 DIAGNOSIS — I480 Paroxysmal atrial fibrillation: Secondary | ICD-10-CM

## 2022-12-27 ENCOUNTER — Other Ambulatory Visit: Payer: Self-pay | Admitting: Sports Medicine

## 2022-12-27 ENCOUNTER — Telehealth: Payer: Self-pay

## 2022-12-27 DIAGNOSIS — M542 Cervicalgia: Secondary | ICD-10-CM

## 2022-12-27 DIAGNOSIS — M479 Spondylosis, unspecified: Secondary | ICD-10-CM

## 2022-12-27 NOTE — Telephone Encounter (Signed)
Pt did call back. He spoke with one of our schedulers. She was able to get him in late August to see Dr. Allyson Sabal.

## 2022-12-27 NOTE — Telephone Encounter (Signed)
Left message for pt to call back to get scheduled with Dr. Allyson Sabal.

## 2023-01-03 ENCOUNTER — Ambulatory Visit: Admission: RE | Admit: 2023-01-03 | Payer: Medicare HMO | Source: Ambulatory Visit

## 2023-01-03 DIAGNOSIS — M479 Spondylosis, unspecified: Secondary | ICD-10-CM

## 2023-01-03 DIAGNOSIS — M542 Cervicalgia: Secondary | ICD-10-CM

## 2023-01-04 ENCOUNTER — Ambulatory Visit (HOSPITAL_COMMUNITY)
Admission: RE | Admit: 2023-01-04 | Discharge: 2023-01-04 | Disposition: A | Discharge status: Home / Self Care | Payer: Medicare HMO | Source: Ambulatory Visit | Attending: Internal Medicine | Admitting: Internal Medicine

## 2023-01-04 DIAGNOSIS — I480 Paroxysmal atrial fibrillation: Secondary | ICD-10-CM | POA: Diagnosis not present

## 2023-01-04 DIAGNOSIS — I080 Rheumatic disorders of both mitral and aortic valves: Secondary | ICD-10-CM | POA: Insufficient documentation

## 2023-01-04 DIAGNOSIS — I517 Cardiomegaly: Secondary | ICD-10-CM

## 2023-01-04 DIAGNOSIS — E785 Hyperlipidemia, unspecified: Secondary | ICD-10-CM | POA: Insufficient documentation

## 2023-01-04 DIAGNOSIS — I10 Essential (primary) hypertension: Secondary | ICD-10-CM | POA: Insufficient documentation

## 2023-01-04 DIAGNOSIS — I77810 Thoracic aortic ectasia: Secondary | ICD-10-CM

## 2023-01-04 LAB — ECHOCARDIOGRAM COMPLETE
Area-P 1/2: 2.36 cm2
Calc EF: 52.5 %
Est EF: 55
S' Lateral: 3.3 cm
Single Plane A2C EF: 51.7 %
Single Plane A4C EF: 54.5 %

## 2023-01-04 NOTE — Progress Notes (Signed)
Echocardiogram 2D Echocardiogram has been performed.  Travis Hendricks 01/04/2023, 1:27 PM

## 2023-01-31 ENCOUNTER — Encounter (HOSPITAL_COMMUNITY): Payer: Self-pay | Admitting: Internal Medicine

## 2023-01-31 ENCOUNTER — Ambulatory Visit (HOSPITAL_COMMUNITY)
Admission: RE | Admit: 2023-01-31 | Discharge: 2023-01-31 | Disposition: A | Discharge status: Home / Self Care | Payer: Medicare HMO | Source: Ambulatory Visit | Attending: Internal Medicine | Admitting: Internal Medicine

## 2023-01-31 VITALS — BP 110/72 | HR 40 | Ht 68.0 in | Wt 157.4 lb

## 2023-01-31 DIAGNOSIS — I10 Essential (primary) hypertension: Secondary | ICD-10-CM | POA: Diagnosis present

## 2023-01-31 DIAGNOSIS — D6869 Other thrombophilia: Secondary | ICD-10-CM | POA: Diagnosis not present

## 2023-01-31 DIAGNOSIS — Z7901 Long term (current) use of anticoagulants: Secondary | ICD-10-CM | POA: Diagnosis not present

## 2023-01-31 DIAGNOSIS — R9431 Abnormal electrocardiogram [ECG] [EKG]: Secondary | ICD-10-CM | POA: Diagnosis not present

## 2023-01-31 DIAGNOSIS — I4510 Unspecified right bundle-branch block: Secondary | ICD-10-CM | POA: Diagnosis not present

## 2023-01-31 DIAGNOSIS — E785 Hyperlipidemia, unspecified: Secondary | ICD-10-CM | POA: Diagnosis present

## 2023-01-31 DIAGNOSIS — I480 Paroxysmal atrial fibrillation: Secondary | ICD-10-CM | POA: Diagnosis not present

## 2023-01-31 MED ORDER — METOPROLOL SUCCINATE ER 25 MG PO TB24: 12.5000 mg | ORAL_TABLET | Freq: Every day | ORAL | Status: AC

## 2023-01-31 NOTE — Progress Notes (Signed)
Primary Care Physician: Tisovec, Adelfa Koh, MD Primary Cardiologist: None Electrophysiologist: None     Referring Physician: ED at [facility] Travis Hendricks is a 82 y.o. male with a history of HTN, HLD, and paroxysmal atrial fibrillation who presents for consultation in the [facility]. The patient was initially diagnosed with atrial fibrillation during ED visit on 6/30. He felt dizzy while walking his dog and noted his BP to be in the 60s systolic and HR was fast. He was discharged on Toprol 25 mg daily. Patient notes he began monitoring his BP more recently as Apple Watch would tell him frequently his HR was in the 30s. Patient is on Eliquis 5 mg BID for a CHADS2VASC score of 3.  On evaluation today, patient is currently in NSR. He admits to being sick the week preceding ED evaluation and had emesis couple nights prior. He was not sure if had GI illness. He feels well currently and notes historically to have a lower HR at baseline. He plays pickleball frequently. Patient has an Apple Watch to monitor his rhythm. He intermittently snores at night per wife but it is not a lot and no stop breathing. He has 1 cup of coffee daily and has around 4-6 drinks on the weekend but no alcohol during the week.   On follow up 01/31/23, patient is currently in NSR. Cardiac monitor worn 12/2022 showed predominantly sinus rhythm. He feels well overall. No missed doses of Eliquis. He has been taking 1 whole tablet of Toprol instead of half tablet daily.   Today, he denies symptoms of palpitations, chest pain, shortness of breath, orthopnea, PND, lower extremity edema, dizziness, presyncope, syncope, snoring, daytime somnolence, bleeding, or neurologic sequela. The patient is tolerating medications without difficulties and is otherwise without complaint today.    he has a BMI of Body mass index is 23.93 kg/m.Marland Kitchen Filed Weights   01/31/23 1412  Weight: 71.4 kg    Current  Outpatient Medications  Medication Sig Dispense Refill   apixaban (ELIQUIS) 5 MG TABS tablet Take 1 tablet (5 mg total) by mouth 2 (two) times daily. 60 tablet 5   Ascorbic Acid (VITAMIN C) 1000 MG tablet Take 1,000 mg by mouth daily.     MAGNESIUM PO Take 1 tablet by mouth at bedtime.     Probiotic Product (PROBIOTIC PO) Take by mouth daily. Seed     rosuvastatin (CRESTOR) 10 MG tablet Take 10 mg by mouth at bedtime.     tadalafil (CIALIS) 5 MG tablet Take 5 mg by mouth as needed.     traMADol (ULTRAM) 50 MG tablet Take 1 tablet (50 mg total) by mouth every 8 (eight) hours as needed for moderate pain or severe pain. 25 tablet 0   TURMERIC PO Take 1 tablet by mouth daily.     vitamin E 45 MG (100 UNITS) capsule daily.     BLACK CURRANT SEED OIL PO Take 1 tablet by mouth daily. (Patient not taking: Reported on 01/31/2023)     Glucosamine-Chondroitin 166.7-133.3 MG CAPS Take 2 tablets by mouth every morning. (Patient not taking: Reported on 01/31/2023)     losartan (COZAAR) 25 MG tablet Take 25 mg by mouth daily. (Patient not taking: Reported on 01/31/2023)     metoprolol succinate (TOPROL-XL) 25 MG 24 hr tablet Take 0.5 tablets (12.5 mg total) by mouth daily.     No current facility-administered medications for this encounter.    Atrial Fibrillation  Management history:  Previous antiarrhythmic drugs: None Previous cardioversions: None Previous ablations: None Anticoagulation history: Eliquis 5 mg BID  ROS- All systems are reviewed and negative except as per the HPI above.  Physical Exam: BP 110/72   Pulse (!) 40   Ht 5\' 8"  (1.727 m)   Wt 71.4 kg   BMI 23.93 kg/m   GEN- The patient is well appearing, alert and oriented x 3 today.   Neck - no JVD or carotid bruit noted Lungs- Clear to ausculation bilaterally, normal work of breathing Heart- Regular bradycardic rate and rhythm, no murmurs, rubs or gallops, PMI not laterally displaced Extremities- no clubbing, cyanosis, or edema Skin  - no rash or ecchymosis noted  EKG today demonstrates  Vent. rate 40 BPM PR interval 190 ms QRS duration 132 ms QT/QTcB 482/392 ms P-R-T axes -11 -15 -5 Marked sinus bradycardia Right bundle branch block Abnormal ECG When compared with ECG of 20-Dec-2022 14:01, PREVIOUS ECG IS PRESENT  Echo 01/04/23:  1. Left ventricular ejection fraction, by estimation, is 55%. The left  ventricle has normal function. The left ventricle has no regional wall  motion abnormalities. Left ventricular diastolic parameters are consistent  with Grade I diastolic dysfunction  (impaired relaxation). The average left ventricular global longitudinal  strain is -17.7 %. The global longitudinal strain is normal.   2. Right ventricular systolic function is mildly reduced. The right  ventricular size is mildly enlarged.   3. Left atrial size was moderately dilated.   4. Right atrial size was moderately dilated.   5. The mitral valve is normal in structure. Mild mitral valve  regurgitation. No evidence of mitral stenosis.   6. The aortic valve is tricuspid. There is mild calcification of the  aortic valve. There is mild thickening of the aortic valve. Aortic valve  regurgitation is not visualized. Aortic valve sclerosis is present, with  no evidence of aortic valve stenosis.   7. Aortic dilatation noted. There is mild dilatation of the ascending  aorta, measuring 38 mm.   8. The inferior vena cava is normal in size with greater than 50%  respiratory variability, suggesting right atrial pressure of 3 mmHg.   Cardiac monitor 7/2-16/24: Patch Wear Time:  13 days and 21 hours   Predominant underlying rhythm was Sinus Rhythm. Patient had a min HR of 37 bpm, max HR of 143 bpm, and avg HR of 53 bpm.   Less than 1% ventricular and supraventricular ectopy 1 run of VT, 10 beats at 124 BPM 9 SVT episodes, all less than 15 beats No patient triggered episodes   [name], MD  ASSESSMENT & PLAN CHA2DS2-VASc  Score = 3  The patient's score is based upon: CHF History: 0 HTN History: 1 Diabetes History: 0 Stroke History: 0 Vascular Disease History: 0 Age Score: 2 Gender Score: 0      ASSESSMENT AND PLAN: Paroxysmal Atrial Fibrillation (ICD10:  I48.0) The patient's CHA2DS2-VASc score is 3, indicating a 3.2% annual risk of stroke.    He is in NSR today. Overall low burden of Afib. He would like to speak with Dr. Lalla Brothers about Watchman procedure and this is reasonable so will refer. Continue to monitor rhythm and conservative observation for now.   Take Toprol 1/2 tablet (12.5 mg) daily. He has Apple Watch to monitor rhythm.   Secondary Hypercoagulable State (ICD10:  D68.69) The patient is at significant risk for stroke/thromboembolism based upon his CHA2DS2-VASc Score of 3.  Continue Apixaban (Eliquis).   No  missed doses. Continue without interruption.    Follow up as scheduled with Dr. Allyson Sabal. Will refer to Dr. Lalla Brothers to discuss Watchman procedure.   Lake Bells, PA-C  Afib Clinic [facility] [address] [place], [place] [address] [phone]

## 2023-01-31 NOTE — Patient Instructions (Addendum)
Decrease Metoprolol -Take 1/2 tablet by mouth daily

## 2023-02-07 ENCOUNTER — Telehealth (HOSPITAL_COMMUNITY): Payer: Self-pay | Admitting: *Deleted

## 2023-02-07 NOTE — Telephone Encounter (Signed)
Pt called in stating for the last week his BP has been elevated in the 160-170/81-85 range. He has been off losartan since starting metoprolol but this was recently reduced. Discussed with Landry Mellow PA will restart losartan 25mg  once a day and continue metoprolol at 12.5mg  daily. Pt to call with update of BP in 10 days. Pt in agreement.

## 2023-02-15 ENCOUNTER — Encounter: Payer: Self-pay | Admitting: Cardiovascular Disease

## 2023-02-15 ENCOUNTER — Ambulatory Visit: Payer: Medicare HMO | Attending: Cardiovascular Disease | Admitting: Cardiovascular Disease

## 2023-02-15 VITALS — BP 100/62 | HR 51 | Ht 67.0 in | Wt 160.0 lb

## 2023-02-15 DIAGNOSIS — I10 Essential (primary) hypertension: Secondary | ICD-10-CM | POA: Diagnosis not present

## 2023-02-15 DIAGNOSIS — I480 Paroxysmal atrial fibrillation: Secondary | ICD-10-CM

## 2023-02-15 NOTE — Patient Instructions (Signed)

## 2023-02-15 NOTE — Assessment & Plan Note (Signed)
History of PAF diagnosed at the time of an ER visit 12/18/2022 when he felt.  He apparently he had had a GI illness for the week before with diarrhea and had continued to play pickle ball.  He was started on Eliquis.  He saw A-fib clinic 01/31/2023.  He is being referred to Dr. Steffanie Dunn for discussion of a Watchman device.  It certainly possible that his A-fib was brought on by dehydration from diarrhea for a week and continued pickleball.  It is possible that he may not need to be on oral anticoagulation indefinitely although I will leave this to Dr. Steffanie Dunn to decide.  He may benefit from loop recorder implantation and follow-up with his rhythm to help guide that decision.

## 2023-02-15 NOTE — Assessment & Plan Note (Signed)
History of essential hypertension blood pressure measured today 100/62.  He is on losartan and metoprolol.

## 2023-02-15 NOTE — Assessment & Plan Note (Signed)
History of hyperlipidemia on statin therapy with lipid profile performed 05/16/2022 revealing total cholesterol 160, LDL of 92 and HDL 52.

## 2023-02-15 NOTE — Progress Notes (Signed)
02/15/2023 Travis Hendricks   1940-10-22  [id number]  Primary Physician Tisovec, Adelfa Koh, MD Primary Cardiologist: Runell Gess MD Nicholes Calamity, [place]  HPI:  Travis Hendricks is a 82 y.o. thin and fit appearing married Caucasian male father of 3 daughters, grandfather of 4 grandchildren is accompanied by his wife Travis Hendricks today.  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  He was referred because of recent recent episode of PAF.  This was diagnosed during an ER visit 12/18/2022 when he was found to be in A-fib/a flutter and begun on Eliquis.  Apparently had had a GI illness for a week and a half prior to that with diarrhea and continue to play pickle ball.  He is very active.  His risk factors include treated hypertension and hyperlipidemia.  He had a 2D echo performed 01/04/2023 that revealed normal LV systolic function, and a moderately dilated left.  There were no significant valvular abnormalities.  A 2-week Zio patch showed short runs of SVT but no other arrhythmias.  He apparently has an appointment with Dr. Steffanie Dunn in the near future to discuss the Watchman device.   Current Meds  Medication Sig   apixaban (ELIQUIS) 5 MG TABS tablet Take 1 tablet (5 mg total) by mouth 2 (two) times daily.   Ascorbic Acid (VITAMIN C) 1000 MG tablet Take 1,000 mg by mouth daily.   losartan (COZAAR) 25 MG tablet Take 25 mg by mouth daily.   MAGNESIUM PO Take 1 tablet by mouth at bedtime.   metoprolol succinate (TOPROL-XL) 25 MG 24 hr tablet Take 0.5 tablets (12.5 mg total) by mouth daily.   Probiotic Product (PROBIOTIC PO) Take by mouth daily. Seed   rosuvastatin (CRESTOR) 10 MG tablet Take 10 mg by mouth at bedtime.   tadalafil (CIALIS) 20 MG tablet Take 20 mg by mouth daily as needed.   traMADol (ULTRAM) 50 MG tablet Take 1 tablet (50 mg total) by mouth every 8 (eight) hours as needed for moderate pain or severe pain.   TURMERIC PO Take 1 tablet by mouth daily.   vitamin E 45 MG  (100 UNITS) capsule daily.     Allergies  Allergen Reactions   Other Nausea And Vomiting    Hard Nuts    Social History   Socioeconomic History   Marital status: Married    Spouse name: [place]   Number of children: 3   Years of education: Not on file   Highest education level: Not on file  Occupational History    Comment: retired  Tobacco Use   Smoking status: Never   Smokeless tobacco: Never  Vaping Use   Vaping status: Never Used  Substance and Sexual Activity   Alcohol use: Not Currently    Comment: occasiona;   Drug use: No   Sexual activity: Yes  Other Topics Concern   Not on file  Social History Narrative   Not on file   Social Determinants of Health   Financial Resource Strain: Not on file  Food Insecurity: No Food Insecurity (02/09/2022)   Hunger Vital Sign    Worried About Running Out of Food in the Last Year: Never true    Ran Out of Food in the Last Year: Never true  Transportation Needs: No Transportation Needs (02/09/2022)   PRAPARE - Administrator, Civil Service (Medical): No    Lack of Transportation (Non-Medical): No  Physical Activity: Not on file  Stress: Not on  file  Social Connections: Not on file  Intimate Partner Violence: Not on file     Review of Systems: General: negative for chills, fever, night sweats or weight changes.  Cardiovascular: negative for chest pain, dyspnea on exertion, edema, orthopnea, palpitations, paroxysmal nocturnal dyspnea or shortness of breath Dermatological: negative for rash Respiratory: negative for cough or wheezing Urologic: negative for hematuria Abdominal: negative for nausea, vomiting, diarrhea, bright red blood per rectum, melena, or hematemesis Neurologic: negative for visual changes, syncope, or dizziness All other systems reviewed and are otherwise negative except as noted above.    Blood pressure 100/62, pulse (!) 51, height 5\' 7"  (1.702 m), weight 160 lb (72.6 kg), SpO2 96%.   General appearance: alert and no distress Neck: no adenopathy, no carotid bruit, no JVD, supple, symmetrical, trachea midline, and thyroid not enlarged, symmetric, no tenderness/mass/nodules Lungs: clear to auscultation bilaterally Heart: Regular rate and rhythm without murmurs gallops rubs or clicks. Extremities: extremities normal, atraumatic, no cyanosis or edema Pulses: 2+ and symmetric Skin: Skin color, texture, turgor normal. No rashes or lesions Neurologic: Grossly normal  EKG not performed today      ASSESSMENT AND PLAN:   HYPERLIPIDEMIA History of hyperlipidemia on statin therapy with lipid profile performed 05/16/2022 revealing total cholesterol 160, LDL of 92 and HDL 52.  Essential hypertension History of essential hypertension blood pressure measured today 100/62.  He is on losartan and metoprolol.  Paroxysmal atrial fibrillation (HCC) History of PAF diagnosed at the time of an ER visit 12/18/2022 when he felt.  He apparently he had had a GI illness for the week before with diarrhea and had continued to play pickle ball.  He was started on Eliquis.  He saw A-fib clinic 01/31/2023.  He is being referred to Dr. Steffanie Dunn for discussion of a Watchman device.  It certainly possible that his A-fib was brought on by dehydration from diarrhea for a week and continued pickleball.  It is possible that he may not need to be on oral anticoagulation indefinitely although I will leave this to Dr. Steffanie Dunn to decide.  He may benefit from loop recorder implantation and follow-up with his rhythm to help guide that decision.     Runell Gess MD FACP,FACC,FAHA, [facility] 02/15/2023 2:52 PM

## 2023-02-23 ENCOUNTER — Encounter: Payer: Self-pay | Admitting: Cardiology

## 2023-02-23 ENCOUNTER — Ambulatory Visit: Payer: Medicare HMO | Attending: Cardiology | Admitting: Cardiology

## 2023-02-23 VITALS — BP 118/80 | HR 47 | Ht 67.0 in | Wt 162.2 lb

## 2023-02-23 DIAGNOSIS — I480 Paroxysmal atrial fibrillation: Secondary | ICD-10-CM | POA: Diagnosis not present

## 2023-02-23 DIAGNOSIS — R9431 Abnormal electrocardiogram [ECG] [EKG]: Secondary | ICD-10-CM | POA: Diagnosis not present

## 2023-02-23 NOTE — Progress Notes (Signed)
Electrophysiology Office Note:    Date:  02/23/2023   ID:  ELISIO Hendricks, DOB [birth year], MRN [id number]  [facility] Cardiologist:  None  [facility] Electrophysiologist:  Lanier Prude, MD   Referring MD: Gaspar Garbe, MD   Chief Complaint: Atrial fibrillation  History of Present Illness:    Travis Hendricks is a 82 y.o. malewho I am seeing today for an evaluation of atrial fibrillation at the request of Dr Wylene Simmer.  The patient was last seen by Charlann Lange, PA-C on January 31, 2023.  The patient has a medical history that includes hypertension, hyperlipidemia and paroxysmal atrial fibrillation.  He was first diagnosed with atrial fibrillation on June 30 in the emergency department.  He takes Eliquis for stroke prophylaxis.  He was sick the week prior to his presentation to the emergency department.  The patient is active and plays pickle ball frequently.  He uses an Scientist, physiological for heart rhythm monitoring.Travis Hendricks  He saw Dr. Gery Pray on February 15, 2023.  He is with his wife today in clinic.  He tells me that he has not had another episode of atrial fibrillation.  He was highly symptomatic with his first episode of atrial fibrillation that occurred during his GI illness.  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     Their past medical, social and family history was reveiwed.   ROS:   Please see the history of present illness.    All other systems reviewed and are negative.  EKGs/Labs/Other Studies Reviewed:    The following studies were reviewed today:  January 09, 2023 ZIO monitor No A-fib episodes Average heart rate 53 bpm  January 04, 2023 echo EF 55% RV function mildly reduced Moderately dilated left and right atrium Mild MR        Physical Exam:    VS:  BP 118/80   Pulse (!) 47   Ht 5\' 7"  (1.702 m)   Wt 162 lb 3.2 oz (73.6 kg)   SpO2 99%   BMI 25.40 kg/m     Wt Readings from Last 3 Encounters:  02/23/23  162 lb 3.2 oz (73.6 kg)  02/15/23 160 lb (72.6 kg)  01/31/23 157 lb 6.4 oz (71.4 kg)     GEN:  Well nourished, well developed in no acute distress.  Appears younger than stated age CARDIAC: RRR, no murmurs, rubs, gallops RESPIRATORY:  Clear to auscultation without rales, wheezing or rhonchi       ASSESSMENT AND PLAN:    1. Nonspecific abnormal electrocardiogram (ECG) (EKG)   2. Paroxysmal atrial fibrillation (HCC)     #Paroxysmal atrial fibrillation Low burden.  His first episode of atrial fibrillation was in the setting of GI illness.  He has not had a recurrence that he is aware of.  He is currently taking Eliquis.  Today I discussed strategies for stroke risk mitigation given 1 prior documented atrial fibrillation episode in the setting of an acute GI illness.  We discussed stopping the anticoagulant after a loop recorder is implanted for atrial fibrillation surveillance and only restarting should he have a recurrence of arrhythmia.  We discussed how there theoretically is a slight increased risk of stroke being off anticoagulation but this may be outweighed by the risks of being on anticoagulation indefinitely in the absence of atrial fibrillation.  We also discussed left atrial appendage occlusion.  I discussed the procedure including its risks, recovery and limitations  during today's appointment.  After our discussion, the patient, his wife and I mutually decided to proceed with loop recorder implant.  Will plan for Medtronic device.After the loop is implanted, he can stop his anticoagulant.  He will start an aspirin 81 mg by mouth when he stops his anticoagulant.   HAS-BLED score 2 Hypertension Yes  Abnormal renal and liver function (Dialysis, transplant, Cr >2.26 mg/dL /Cirrhosis or Bilirubin >2x Normal or AST/ALT/AP >3x Normal) No  Stroke No  Bleeding No  Labile INR (Unstable/high INR) No  Elderly (>65) Yes  Drugs or alcohol (? 8 drinks/week, anti-plt or NSAID) No    CHA2DS2-VASc Score = 3  The patient's score is based upon: CHF History: 0 HTN History: 1 Diabetes History: 0 Stroke History: 0 Vascular Disease History: 0 Age Score: 2 Gender Score: 0   Follow-up with me for Medtronic loop implant.    Signed, Rossie Muskrat. Lalla Brothers, MD, [facility], [facility] 02/23/2023 5:27 PM    Electrophysiology [facility]

## 2023-02-23 NOTE — Patient Instructions (Addendum)
Medication Instructions:  Your physician recommends that you continue on your current medications as directed. Please refer to the Current Medication list given to you today.  *If you need a refill on your cardiac medications before your next appointment, please call your pharmacy*  Testing/Procedures: You will have a loop recorder implanted at your next office visit. There are no restrictions or special instructions prior to this visit.   Follow-Up: At [facility], you and your health needs are our priority.  As part of our continuing mission to provide you with exceptional heart care, we have created designated Provider Care Teams.  These Care Teams include your primary Cardiologist (physician) and Advanced Practice Providers (APPs -  Physician Assistants and Nurse Practitioners) who all work together to provide you with the care you need, when you need it.  Your next appointment:   9/25 at 1:40pm

## 2023-03-14 NOTE — Progress Notes (Unsigned)
Electrophysiology Office Follow up Visit Note:    Date:  03/14/2023   ID:  Travis Hendricks, DOB [birth year], MRN [id number]  PCP:  Gaspar Garbe, MD  [facility] Cardiologist:  None  [facility] Electrophysiologist:  Lanier Prude, MD    Interval History:    Travis Hendricks is a 82 y.o. male who presents for a follow up visit.   Last seen February 23, 2023.  He has a history of paroxysmal atrial fibrillation with only 1 documented history of A-fib in the past.  At her last appointment we discussed using a loop recorder for atrial fibrillation surveillance as a strategy to guide anticoagulant use.  He presents today for Medtronic loop recorder implant.       Past medical, surgical, social and family history were reviewed.  ROS:   Please see the history of present illness.    All other systems reviewed and are negative.  EKGs/Labs/Other Studies Reviewed:    The following studies were reviewed today:        Physical Exam:    VS:  There were no vitals taken for this visit.    Wt Readings from Last 3 Encounters:  02/23/23 162 lb 3.2 oz (73.6 kg)  02/15/23 160 lb (72.6 kg)  01/31/23 157 lb 6.4 oz (71.4 kg)     GEN: *** Well nourished, well developed in no acute distress CARDIAC: ***RRR, no murmurs, rubs, gallops RESPIRATORY:  Clear to auscultation without rales, wheezing or rhonchi       ASSESSMENT:    No diagnosis found. PLAN:    In order of problems listed above:   #Paroxysmal atrial fibrillation Low burden, only 1 episode documented. Presents today for loop recorder monitor implant for atrial fibrillation surveillance.  I discussed loop recorder monitoring in detail with the patient including the risks of implant and monthly monitoring cost and he wishes to proceed.      Signed, Steffanie Dunn, MD, [facility], [facility] 03/14/2023 9:15 PM    Electrophysiology [facility]  --------------------  SURGEON:  Lanier Prude, MD     PREPROCEDURE DIAGNOSIS:  Atrial fibrillation    POSTPROCEDURE DIAGNOSIS: Atrial fibrillation     PROCEDURES:   1. Implantable loop recorder implantation    INTRODUCTION:  Travis Hendricks presents with a history of atrial fibrillation The costs of loop recorder monitoring have been discussed with the patient.    DESCRIPTION OF PROCEDURE:  Informed written consent was obtained.  A preprocedural timeout was performed with the RN (***). The patient required no sedation for the procedure today.  Mapping over the patient's chest was performed to identify the area where electrograms were most prominent for ILR recording.  This area was found to be the left parasternal region over the 4th intercostal space. The patients left chest was therefore prepped and draped in the usual sterile fashion. The skin overlying the left parasternal region was infiltrated with lidocaine for local analgesia.  A 0.5-cm incision was made over the left parasternal region over the 3rd intercostal space.  A subcutaneous ILR pocket was fashioned using a combination of sharp and blunt dissection.  A {[id number]:27736} implantable loop recorder was then placed into the pocket  R waves were very prominent and measured >0.33mV.  Steri- Strips and a sterile dressing were then applied.  There were no early apparent complications.     CONCLUSIONS:   1. Successful implantation of a implantable loop recorder for Atrial fibrillation  2. No  early apparent complications.   Sheria Lang T. Lalla Brothers, MD, [facility], [facility] Cardiac Electrophysiology

## 2023-03-15 ENCOUNTER — Encounter: Payer: Self-pay | Admitting: Cardiology

## 2023-03-15 ENCOUNTER — Ambulatory Visit: Payer: Medicare HMO | Attending: Cardiology | Admitting: Cardiology

## 2023-03-15 VITALS — Ht 68.0 in | Wt 163.0 lb

## 2023-03-15 DIAGNOSIS — I480 Paroxysmal atrial fibrillation: Secondary | ICD-10-CM

## 2023-03-15 NOTE — Patient Instructions (Addendum)
Medication Instructions:  Your physician recommends that you continue on your current medications as directed. Please refer to the Current Medication list given to you today.  Labwork: None ordered.  Testing/Procedures: None ordered.  Follow-Up:  Your physician wants you to follow-up in: 3 months with Sherie Don, NP.  You will receive a reminder letter in the mail two months in advance. If you don't receive a letter, please call our office to schedule the follow-up appointment.   Implantable Loop Recorder Placement, Care After This sheet gives you information about how to care for yourself after your procedure. Your health care provider may also give you more specific instructions. If you have problems or questions, contact your health care provider. What can I expect after the procedure? After the procedure, it is common to have: Soreness or discomfort near the incision. Some swelling or bruising near the incision.  Follow these instructions at home: Incision care  Monitor your cardiac device site for redness, swelling, and drainage. Call the device clinic at [phone] if you experience these symptoms or fever/chills.  Keep the large square bandage on your site for 24 hours and then you may remove it yourself. Keep the steri-strips underneath in place.   You may shower after 72 hours / 3 days from your procedure with the steri-strips in place. They will usually fall off on their own, or may be removed after 10 days. Pat dry.   Avoid lotions, ointments, or perfumes over your incision until it is well-healed.  Please do not submerge in water until your site is completely healed.   Your device is MRI compatible.   Remote monitoring is used to monitor your cardiac device from home. This monitoring is scheduled every month by our office. It allows [place] to keep an eye on the function of your device to ensure it is working properly.  If your wound site starts to bleed apply  pressure.    For help with the monitor please call Medtronic Monitor Support Specialist directly at [phone].    If you have any questions/concerns please call the device clinic at [phone].  Activity  Return to your normal activities.  General instructions Follow instructions from your health care provider about how to manage your implantable loop recorder and transmit the information. Learn how to activate a recording if this is necessary for your type of device. You may go through a metal detection gate, and you may let someone hold a metal detector over your chest. Show your ID card if needed. Do not have an MRI unless you check with your health care provider first. Take over-the-counter and prescription medicines only as told by your health care provider. Keep all follow-up visits as told by your health care provider. This is important. Contact a health care provider if: You have redness, swelling, or pain around your incision. You have a fever. You have pain that is not relieved by your pain medicine. You have triggered your device because of fainting (syncope) or because of a heartbeat that feels like it is racing, slow, fluttering, or skipping (palpitations). Get help right away if you have: Chest pain. Difficulty breathing. Summary After the procedure, it is common to have soreness or discomfort near the incision. Change your dressing as told by your health care provider. Follow instructions from your health care provider about how to manage your implantable loop recorder and transmit the information. Keep all follow-up visits as told by your health care provider. This is important. This information  is not intended to replace advice given to you by your health care provider. Make sure you discuss any questions you have with your health care provider. Document Released: 05/18/2015 Document Revised: 07/22/2017 Document Reviewed: 07/22/2017 Elsevier Patient Education   2020 [employer].

## 2023-04-17 ENCOUNTER — Ambulatory Visit (INDEPENDENT_AMBULATORY_CARE_PROVIDER_SITE_OTHER): Payer: Medicare HMO

## 2023-04-17 DIAGNOSIS — I480 Paroxysmal atrial fibrillation: Secondary | ICD-10-CM

## 2023-04-18 LAB — CUP PACEART REMOTE DEVICE CHECK
Date Time Interrogation Session: 20241028151510
Implantable Pulse Generator Implant Date: 20240925

## 2023-05-08 NOTE — Progress Notes (Signed)
Carelink Summary Report / Loop Recorder 

## 2023-05-21 LAB — CUP PACEART REMOTE DEVICE CHECK
Date Time Interrogation Session: 20241130151549
Implantable Pulse Generator Implant Date: 20240925

## 2023-05-22 ENCOUNTER — Ambulatory Visit: Payer: Medicare HMO

## 2023-05-22 DIAGNOSIS — I480 Paroxysmal atrial fibrillation: Secondary | ICD-10-CM | POA: Diagnosis not present

## 2023-06-08 ENCOUNTER — Encounter: Payer: Self-pay | Admitting: Cardiology

## 2023-06-08 ENCOUNTER — Ambulatory Visit: Payer: Medicare HMO | Attending: Cardiology | Admitting: Cardiology

## 2023-06-08 VITALS — BP 110/58 | HR 54 | Ht 67.5 in | Wt 165.8 lb

## 2023-06-08 DIAGNOSIS — R9431 Abnormal electrocardiogram [ECG] [EKG]: Secondary | ICD-10-CM | POA: Diagnosis not present

## 2023-06-08 DIAGNOSIS — I480 Paroxysmal atrial fibrillation: Secondary | ICD-10-CM | POA: Diagnosis not present

## 2023-06-08 NOTE — Patient Instructions (Signed)
Medication Instructions:   STOP Eliquis  *If you need a refill on your cardiac medications before your next appointment, please call your pharmacy*   Lab Work:  None Ordered  If you have labs (blood work) drawn today and your tests are completely normal, you will receive your results only by: MyChart Message (if you have MyChart) OR A paper copy in the mail If you have any lab test that is abnormal or we need to change your treatment, we will call you to review the results.   Testing/Procedures:  None Ordered    Follow-Up: At [facility], you and your health needs are our priority.  As part of our continuing mission to provide you with exceptional heart care, we have created designated Provider Care Teams.  These Care Teams include your primary Cardiologist (physician) and Advanced Practice Providers (APPs -  Physician Assistants and Nurse Practitioners) who all work together to provide you with the care you need, when you need it.  We recommend signing up for the patient portal called "MyChart".  Sign up information is provided on this After Visit Summary.  MyChart is used to connect with patients for Virtual Visits (Telemedicine).  Patients are able to view lab/test results, encounter notes, upcoming appointments, etc.  Non-urgent messages can be sent to your provider as well.   To learn more about what you can do with MyChart, go to [email or web address].    Your next appointment:   12 month(s)  Provider:   Steffanie Dunn, MD or Sherie Don, NP

## 2023-06-08 NOTE — Progress Notes (Signed)
Cardiology Clinic Note    Date:  06/08/2023  Patient ID:  Travis, Hendricks [birth year], MRN [id number] PCP:  Gaspar Garbe, MD  Cardiologist:  None Electrophysiologist: Lanier Prude, MD    Patient Profile    Chief Complaint: afib follow-up  History of Present Illness: Travis Hendricks is a 82 y.o. male with PMH notable for parox AFib, ILR in situ, HTN, HLD; seen today for Lanier Prude, MD for routine electrophysiology followup.  He has had one afib episode 11/2022. ILR placed 02/2023 for ongoing arrhythmia evaluation.  On follow-up today, he has had no afib symptoms. He is tolerating eliquis well without bleeding concerns, but desires to stop it so as to prevent bleeding. He is very active, plays multiple sports, sails, off-shore fishes. He denies chest pain, chest pressure, SOB, dizziness.   Device Information: MDT ILR, imp 02/2023; dx afib  AAD History: None     ROS:  Please see the history of present illness. All other systems are reviewed and otherwise negative.    Physical Exam    VS:  BP (!) 110/58 (BP Location: Left Arm, Patient Position: Sitting, Cuff Size: Normal)   Pulse (!) 54   Ht 5' 7.5" (1.715 m)   Wt 165 lb 12.8 oz (75.2 kg)   SpO2 98%   BMI 25.58 kg/m  BMI: Body mass index is 25.58 kg/m.  Wt Readings from Last 3 Encounters:  06/08/23 165 lb 12.8 oz (75.2 kg)  03/15/23 163 lb (73.9 kg)  02/23/23 162 lb 3.2 oz (73.6 kg)     GEN- The patient is well appearing, alert and oriented x 3 today.   Lungs- Clear to ausculation bilaterally, normal work of breathing.  Heart- Regular rate and rhythm, no murmurs, rubs or gallops Extremities- No peripheral edema, warm, dry Skin-  ILR insertion site well-healed, no tethering   Device interrogation done today and reviewed by myself:  Battery good No episodes Low PVC burden  Studies Reviewed   Previous EP, cardiology notes.    EKG is ordered. Personal review of EKG from today shows:     EKG Interpretation Date/Time:  Thursday June 08 2023 13:30:12 EST Ventricular Rate:  54 PR Interval:  184 QRS Duration:  130 QT Interval:  458 QTC Calculation: 434 R Axis:   25  Text Interpretation: Sinus bradycardia Right bundle branch block Confirmed by Sherie Don [phone]) on 06/08/2023 1:39:03 PM    Long term monitor, 01/06/2023 Predominant underlying rhythm was Sinus Rhythm. Patient had a min HR of 37 bpm, max HR of 143 bpm, and avg HR of 53 bpm.   Less than 1% ventricular and supraventricular ectopy 1 run of VT, 10 beats at 124 BPM 9 SVT episodes, all less than 15 beats No patient triggered episodes  TTE, 01/04/2023  1. Left ventricular ejection fraction, by estimation, is 55%. The left ventricle has normal function. The left ventricle has no regional wall motion abnormalities. Left ventricular diastolic parameters are consistent with Grade I diastolic dysfunction (impaired relaxation). The average left ventricular global longitudinal strain is -17.7 %. The global longitudinal strain is normal.   2. Right ventricular systolic function is mildly reduced. The right ventricular size is mildly enlarged.   3. Left atrial size was moderately dilated.   4. Right atrial size was moderately dilated.   5. The mitral valve is normal in structure. Mild mitral valve regurgitation. No evidence of mitral stenosis.   6. The aortic valve is tricuspid.  There is mild calcification of the aortic valve. There is mild thickening of the aortic valve. Aortic valve regurgitation is not visualized. Aortic valve sclerosis is present, with  no evidence of aortic valve stenosis.   7. Aortic dilatation noted. There is mild dilatation of the ascending aorta, measuring 38 mm.   8. The inferior vena cava is normal in size with greater than 50% respiratory variability, suggesting right atrial pressure of 3 mmHg.      Assessment and Plan     #) Parox AFib #) ILR in situ No recent AFib episodes via ILR or  patient symptoms Patient requests to stop eliquis. Discussed that no monitoring strategy is perfect, patient voiced understanding and still wishes to stop eliquis.  Continue 12.5mg  toprol daily        Current medicines are reviewed at length with the patient today.   The patient has concerns regarding his medicines.  The following changes were made today:   STOP eliquis  Labs/ tests ordered today include:  Orders Placed This Encounter  Procedures   EKG 12-Lead     Disposition: Follow up with Dr. Lalla Brothers or EP APP in 12 months, continue remote ILR monitoring   Signed, Sherie Don, NP  06/08/23  1:55 PM  Electrophysiology [facility]

## 2023-06-15 ENCOUNTER — Ambulatory Visit: Payer: Medicare HMO | Admitting: Cardiology

## 2023-06-26 ENCOUNTER — Ambulatory Visit (INDEPENDENT_AMBULATORY_CARE_PROVIDER_SITE_OTHER): Payer: Medicare HMO

## 2023-06-26 DIAGNOSIS — I480 Paroxysmal atrial fibrillation: Secondary | ICD-10-CM

## 2023-06-28 LAB — CUP PACEART REMOTE DEVICE CHECK
Date Time Interrogation Session: 20250105232131
Implantable Pulse Generator Implant Date: 20240925

## 2023-07-11 ENCOUNTER — Other Ambulatory Visit (HOSPITAL_COMMUNITY): Payer: Self-pay | Admitting: Internal Medicine

## 2023-07-31 ENCOUNTER — Ambulatory Visit (INDEPENDENT_AMBULATORY_CARE_PROVIDER_SITE_OTHER): Payer: Medicare HMO

## 2023-07-31 DIAGNOSIS — I480 Paroxysmal atrial fibrillation: Secondary | ICD-10-CM

## 2023-07-31 LAB — CUP PACEART REMOTE DEVICE CHECK
Date Time Interrogation Session: 20250209232412
Implantable Pulse Generator Implant Date: 20240925

## 2023-08-03 ENCOUNTER — Encounter: Payer: Self-pay | Admitting: Cardiology

## 2023-08-03 DIAGNOSIS — K635 Polyp of colon: Secondary | ICD-10-CM | POA: Diagnosis not present

## 2023-08-03 DIAGNOSIS — K5730 Diverticulosis of large intestine without perforation or abscess without bleeding: Secondary | ICD-10-CM | POA: Diagnosis not present

## 2023-08-03 DIAGNOSIS — Z1211 Encounter for screening for malignant neoplasm of colon: Secondary | ICD-10-CM | POA: Diagnosis not present

## 2023-08-03 DIAGNOSIS — K649 Unspecified hemorrhoids: Secondary | ICD-10-CM | POA: Diagnosis not present

## 2023-08-03 DIAGNOSIS — D120 Benign neoplasm of cecum: Secondary | ICD-10-CM | POA: Diagnosis not present

## 2023-08-07 NOTE — Addendum Note (Signed)
Addended by: Geralyn Flash D on: 08/07/2023 04:37 PM   Modules accepted: Orders

## 2023-08-07 NOTE — Progress Notes (Signed)
 [facility] Summary Report / Loop Recorder

## 2023-08-28 DIAGNOSIS — H52222 Regular astigmatism, left eye: Secondary | ICD-10-CM | POA: Diagnosis not present

## 2023-08-28 DIAGNOSIS — Z961 Presence of intraocular lens: Secondary | ICD-10-CM | POA: Diagnosis not present

## 2023-08-28 DIAGNOSIS — H524 Presbyopia: Secondary | ICD-10-CM | POA: Diagnosis not present

## 2023-08-28 DIAGNOSIS — H35372 Puckering of macula, left eye: Secondary | ICD-10-CM | POA: Diagnosis not present

## 2023-08-28 DIAGNOSIS — H5202 Hypermetropia, left eye: Secondary | ICD-10-CM | POA: Diagnosis not present

## 2023-09-04 ENCOUNTER — Ambulatory Visit (INDEPENDENT_AMBULATORY_CARE_PROVIDER_SITE_OTHER): Payer: Medicare HMO

## 2023-09-04 DIAGNOSIS — I480 Paroxysmal atrial fibrillation: Secondary | ICD-10-CM

## 2023-09-05 LAB — CUP PACEART REMOTE DEVICE CHECK
Date Time Interrogation Session: 20250316232214
Implantable Pulse Generator Implant Date: 20240925

## 2023-09-10 ENCOUNTER — Encounter: Payer: Self-pay | Admitting: Cardiology

## 2023-09-11 NOTE — Progress Notes (Signed)
 [facility] Summary Report / Loop Recorder

## 2023-09-11 NOTE — Addendum Note (Signed)
 Addended by: Geralyn Flash D on: 09/11/2023 11:48 AM   Modules accepted: Orders

## 2023-09-13 ENCOUNTER — Ambulatory Visit (HOSPITAL_COMMUNITY)
Admission: RE | Admit: 2023-09-13 | Discharge: 2023-09-13 | Disposition: A | Discharge status: Home / Self Care | Payer: Self-pay | Source: Ambulatory Visit | Attending: Internal Medicine | Admitting: Internal Medicine

## 2023-09-13 VITALS — BP 120/64 | HR 55 | Ht 67.5 in | Wt 165.8 lb

## 2023-09-13 DIAGNOSIS — Z7901 Long term (current) use of anticoagulants: Secondary | ICD-10-CM | POA: Insufficient documentation

## 2023-09-13 DIAGNOSIS — I10 Essential (primary) hypertension: Secondary | ICD-10-CM | POA: Diagnosis not present

## 2023-09-13 DIAGNOSIS — D6869 Other thrombophilia: Secondary | ICD-10-CM | POA: Diagnosis not present

## 2023-09-13 DIAGNOSIS — E785 Hyperlipidemia, unspecified: Secondary | ICD-10-CM | POA: Insufficient documentation

## 2023-09-13 DIAGNOSIS — I480 Paroxysmal atrial fibrillation: Secondary | ICD-10-CM | POA: Diagnosis not present

## 2023-09-13 DIAGNOSIS — Z79899 Other long term (current) drug therapy: Secondary | ICD-10-CM | POA: Diagnosis not present

## 2023-09-13 DIAGNOSIS — R001 Bradycardia, unspecified: Secondary | ICD-10-CM | POA: Diagnosis not present

## 2023-09-13 DIAGNOSIS — I4510 Unspecified right bundle-branch block: Secondary | ICD-10-CM | POA: Diagnosis not present

## 2023-09-13 MED ORDER — APIXABAN 5 MG PO TABS: 5.0000 mg | ORAL_TABLET | Freq: Two times a day (BID) | ORAL | 11 refills | Status: AC

## 2023-09-13 NOTE — Patient Instructions (Signed)
 Start Eliquis 5mg  twice a day

## 2023-09-13 NOTE — Progress Notes (Signed)
 Primary Care Physician: Tisovec, Adelfa Koh, MD Primary Cardiologist: None Electrophysiologist: Lanier Prude, MD     Referring Physician: ED at [facility] Travis Hendricks is a 83 y.o. male with a history of HTN, HLD, and paroxysmal atrial fibrillation who presents for consultation in the [facility]. The patient was initially diagnosed with atrial fibrillation during ED visit on 6/30. He felt dizzy while walking his dog and noted his BP to be in the 60s systolic and HR was fast. He was discharged on Toprol 25 mg daily. Patient notes he began monitoring his BP more recently as Apple Watch would tell him frequently his HR was in the 30s. Patient was previously on Eliquis 5 mg BID for a CHADS2VASC score of 3. He is s/p ILR placement by Dr. Lalla Brothers on 03/15/23. He stopped Eliquis in December. ILR interrogation by Dr. Lalla Brothers 09/03/23 showed 1 Afib episode lasting 3 hr 30 minutes.   On follow up 09/13/23, he is currently in NSR. He takes Toprol 12.5 mg daily. He could feel the palpitations on the evening of 3/14 when episode occurred.   Today, he denies symptoms of palpitations, chest pain, shortness of breath, orthopnea, PND, lower extremity edema, dizziness, presyncope, syncope, snoring, daytime somnolence, bleeding, or neurologic sequela. The patient is tolerating medications without difficulties and is otherwise without complaint today.    he has a BMI of Body mass index is 25.58 kg/m.Marland Kitchen Filed Weights   09/13/23 1332  Weight: 75.2 kg     Current Outpatient Medications  Medication Sig Dispense Refill   apixaban (ELIQUIS) 5 MG TABS tablet Take 1 tablet (5 mg total) by mouth 2 (two) times daily. 60 tablet 11   Ascorbic Acid (VITAMIN C) 1000 MG tablet Take 1,000 mg by mouth daily.     benzonatate (TESSALON) 200 MG capsule as needed for cough.     losartan (COZAAR) 25 MG tablet Take 25 mg by mouth daily.     MAGNESIUM PO Take 1 tablet by mouth at bedtime.      metoprolol succinate (TOPROL-XL) 25 MG 24 hr tablet Take 0.5 tablets (12.5 mg total) by mouth daily.     Probiotic Product (PROBIOTIC PO) Take by mouth daily. Seed     rosuvastatin (CRESTOR) 10 MG tablet Take 10 mg by mouth at bedtime.     tadalafil (CIALIS) 20 MG tablet Take 20 mg by mouth daily as needed.     traMADol (ULTRAM) 50 MG tablet Take 1 tablet (50 mg total) by mouth every 8 (eight) hours as needed for moderate pain or severe pain. (Patient taking differently: Take 50 mg by mouth as needed for moderate pain (pain score 4-6) or severe pain (pain score 7-10).) 25 tablet 0   TURMERIC PO Take 1 tablet by mouth daily.     VITAMIN D, CHOLECALCIFEROL, PO Take 1 tablet by mouth every morning.     vitamin E 45 MG (100 UNITS) capsule Taking 1 tablet by mouth daily     Glucosamine-Chondroitin 166.7-133.3 MG CAPS Take 2 tablets by mouth every morning. (Patient not taking: Reported on 09/13/2023)     No current facility-administered medications for this encounter.    Atrial Fibrillation Management history:  Previous antiarrhythmic drugs: None Previous cardioversions: None Previous ablations: None Anticoagulation history: Eliquis 5 mg BID  ROS- All systems are reviewed and negative except as per the HPI above.  Physical Exam: BP 120/64   Pulse (!) 55   Ht  5' 7.5" (1.715 m)   Wt 75.2 kg   BMI 25.58 kg/m   GEN- The patient is well appearing, alert and oriented x 3 today.   Neck - no JVD or carotid bruit noted Lungs- Clear to ausculation bilaterally, normal work of breathing Heart- Regular rate and rhythm, no murmurs, rubs or gallops, PMI not laterally displaced Extremities- no clubbing, cyanosis, or edema Skin - no rash or ecchymosis noted   EKG today demonstrates  Vent. rate 55 BPM PR interval 196 ms QRS duration 130 ms QT/QTcB 438/419 ms P-R-T axes 67 53 53 Sinus bradycardia Right bundle branch block Abnormal ECG When compared with ECG of 08-Jun-2023 13:30, PREVIOUS ECG  IS PRESENT  Echo 01/04/23:  1. Left ventricular ejection fraction, by estimation, is 55%. The left  ventricle has normal function. The left ventricle has no regional wall  motion abnormalities. Left ventricular diastolic parameters are consistent  with Grade I diastolic dysfunction  (impaired relaxation). The average left ventricular global longitudinal  strain is -17.7 %. The global longitudinal strain is normal.   2. Right ventricular systolic function is mildly reduced. The right  ventricular size is mildly enlarged.   3. Left atrial size was moderately dilated.   4. Right atrial size was moderately dilated.   5. The mitral valve is normal in structure. Mild mitral valve  regurgitation. No evidence of mitral stenosis.   6. The aortic valve is tricuspid. There is mild calcification of the  aortic valve. There is mild thickening of the aortic valve. Aortic valve  regurgitation is not visualized. Aortic valve sclerosis is present, with  no evidence of aortic valve stenosis.   7. Aortic dilatation noted. There is mild dilatation of the ascending  aorta, measuring 38 mm.   8. The inferior vena cava is normal in size with greater than 50%  respiratory variability, suggesting right atrial pressure of 3 mmHg.   Cardiac monitor 7/2-16/24: Patch Wear Time:  13 days and 21 hours   Predominant underlying rhythm was Sinus Rhythm. Patient had a min HR of 37 bpm, max HR of 143 bpm, and avg HR of 53 bpm.   Less than 1% ventricular and supraventricular ectopy 1 run of VT, 10 beats at 124 BPM 9 SVT episodes, all less than 15 beats No patient triggered episodes   [name], MD  ASSESSMENT & PLAN CHA2DS2-VASc Score = 3  The patient's score is based upon: CHF History: 0 HTN History: 1 Diabetes History: 0 Stroke History: 0 Vascular Disease History: 0 Age Score: 2 Gender Score: 0      ASSESSMENT AND PLAN: Paroxysmal Atrial Fibrillation (ICD10:  I48.0) The patient's CHA2DS2-VASc score  is 3, indicating a 3.2% annual risk of stroke.   S/p ILR by Dr. Lalla Brothers on 02/23/23.   He is currently in NSR. We discussed conservative observation via ILR review considering this was his second episode. Patient is agreeable with this. Continue Toprol 12.5 mg daily.    Secondary Hypercoagulable State (ICD10:  D68.69) The patient is at significant risk for stroke/thromboembolism based upon his CHA2DS2-VASc Score of 3.   Off Eliquis due to having had ILR placed in September. Due to recent episode of 3 hr 30 min duration will restart Eliquis 5 mg BID. We discussed risks vs benefits of anticoagulation and patient would like to restart blood thinner. He is going to discuss further with wife and consider whether would like to see Dr. Lalla Brothers for follow up to discuss Watchman procedure. He is concerned  about risk of falling playing pickleball.    Follow up Afib clinic 1 year, sooner if increased Afib burden.     Lake Bells, PA-C  Afib Clinic [facility] [address] [place], [place] [address] [phone]

## 2023-09-19 ENCOUNTER — Encounter: Payer: Self-pay | Admitting: Cardiology

## 2023-09-20 DIAGNOSIS — H61002 Unspecified perichondritis of left external ear: Secondary | ICD-10-CM | POA: Diagnosis not present

## 2023-09-20 DIAGNOSIS — L821 Other seborrheic keratosis: Secondary | ICD-10-CM | POA: Diagnosis not present

## 2023-09-20 DIAGNOSIS — Z85828 Personal history of other malignant neoplasm of skin: Secondary | ICD-10-CM | POA: Diagnosis not present

## 2023-09-27 DIAGNOSIS — K080 Exfoliation of teeth due to systemic causes: Secondary | ICD-10-CM | POA: Diagnosis not present

## 2023-10-02 DIAGNOSIS — Z8546 Personal history of malignant neoplasm of prostate: Secondary | ICD-10-CM | POA: Diagnosis not present

## 2023-10-05 DIAGNOSIS — K080 Exfoliation of teeth due to systemic causes: Secondary | ICD-10-CM | POA: Diagnosis not present

## 2023-10-09 ENCOUNTER — Ambulatory Visit (INDEPENDENT_AMBULATORY_CARE_PROVIDER_SITE_OTHER): Payer: Medicare HMO

## 2023-10-09 DIAGNOSIS — Z8546 Personal history of malignant neoplasm of prostate: Secondary | ICD-10-CM | POA: Diagnosis not present

## 2023-10-09 DIAGNOSIS — I480 Paroxysmal atrial fibrillation: Secondary | ICD-10-CM

## 2023-10-09 DIAGNOSIS — R351 Nocturia: Secondary | ICD-10-CM | POA: Diagnosis not present

## 2023-10-09 DIAGNOSIS — N5201 Erectile dysfunction due to arterial insufficiency: Secondary | ICD-10-CM | POA: Diagnosis not present

## 2023-10-09 LAB — CUP PACEART REMOTE DEVICE CHECK
Date Time Interrogation Session: 20250420232150
Implantable Pulse Generator Implant Date: 20240925

## 2023-10-10 ENCOUNTER — Encounter: Payer: Self-pay | Admitting: Cardiology

## 2023-10-25 NOTE — Addendum Note (Signed)
 Addended by: Edra Govern D on: 10/25/2023 04:20 PM   Modules accepted: Orders

## 2023-10-25 NOTE — Progress Notes (Signed)
 [facility] Summary Report / Loop Recorder

## 2023-11-14 ENCOUNTER — Ambulatory Visit (INDEPENDENT_AMBULATORY_CARE_PROVIDER_SITE_OTHER): Payer: Medicare HMO

## 2023-11-14 DIAGNOSIS — I480 Paroxysmal atrial fibrillation: Secondary | ICD-10-CM

## 2023-11-14 LAB — CUP PACEART REMOTE DEVICE CHECK
Date Time Interrogation Session: 20250526233203
Implantable Pulse Generator Implant Date: 20240925

## 2023-11-15 ENCOUNTER — Ambulatory Visit: Payer: Self-pay | Admitting: Cardiology

## 2023-11-15 ENCOUNTER — Telehealth: Payer: Self-pay

## 2023-11-15 NOTE — Telephone Encounter (Signed)
 Call back received from Pt.  He states he normally gets up at 6:00 am, but it is not unusual for him to have low heart rates.  Pt states he wears a smart watch that frequently let's him know his heart rate is low.  Pt states he has never had any symptoms with his low heart rates.  Thanked Pt for call back.  No action needed.

## 2023-11-15 NOTE — Telephone Encounter (Addendum)
 Alert received from [employer]:  ILR summary report received. Battery status OK. Normal device function. No new symptom, tachy, or pause episodes. No new AF episodes. 6 Brady episodes recorded, 2 EGMs available on 11/07/23 at 06:13 and 06:24, longest 44 sec, EGMs c/w bradycardia with minimum V rate of 23 bpm; bradycardia is new finding, to clinic for review  Outreach made to Pt.  Call went to VM.  Need to determine sleeping hours.

## 2023-11-23 DIAGNOSIS — C61 Malignant neoplasm of prostate: Secondary | ICD-10-CM | POA: Diagnosis not present

## 2023-11-23 DIAGNOSIS — I129 Hypertensive chronic kidney disease with stage 1 through stage 4 chronic kidney disease, or unspecified chronic kidney disease: Secondary | ICD-10-CM | POA: Diagnosis not present

## 2023-11-23 DIAGNOSIS — I10 Essential (primary) hypertension: Secondary | ICD-10-CM | POA: Diagnosis not present

## 2023-11-29 NOTE — Progress Notes (Signed)
 [facility] Summary Report / Loop Recorder

## 2023-12-14 ENCOUNTER — Ambulatory Visit: Payer: Self-pay | Admitting: Cardiology

## 2023-12-14 ENCOUNTER — Ambulatory Visit (INDEPENDENT_AMBULATORY_CARE_PROVIDER_SITE_OTHER): Payer: Self-pay

## 2023-12-14 DIAGNOSIS — I480 Paroxysmal atrial fibrillation: Secondary | ICD-10-CM | POA: Diagnosis not present

## 2023-12-14 LAB — CUP PACEART REMOTE DEVICE CHECK
Date Time Interrogation Session: 20250625234321
Implantable Pulse Generator Implant Date: 20240925

## 2024-01-08 NOTE — Progress Notes (Signed)
 [facility] Summary Report / Loop Recorder

## 2024-01-15 ENCOUNTER — Ambulatory Visit (INDEPENDENT_AMBULATORY_CARE_PROVIDER_SITE_OTHER): Payer: Self-pay

## 2024-01-15 DIAGNOSIS — L820 Inflamed seborrheic keratosis: Secondary | ICD-10-CM | POA: Diagnosis not present

## 2024-01-15 DIAGNOSIS — L821 Other seborrheic keratosis: Secondary | ICD-10-CM | POA: Diagnosis not present

## 2024-01-15 DIAGNOSIS — I480 Paroxysmal atrial fibrillation: Secondary | ICD-10-CM

## 2024-01-15 DIAGNOSIS — L570 Actinic keratosis: Secondary | ICD-10-CM | POA: Diagnosis not present

## 2024-01-15 DIAGNOSIS — Z85828 Personal history of other malignant neoplasm of skin: Secondary | ICD-10-CM | POA: Diagnosis not present

## 2024-01-15 DIAGNOSIS — L812 Freckles: Secondary | ICD-10-CM | POA: Diagnosis not present

## 2024-01-15 DIAGNOSIS — H61002 Unspecified perichondritis of left external ear: Secondary | ICD-10-CM | POA: Diagnosis not present

## 2024-01-15 LAB — CUP PACEART REMOTE DEVICE CHECK
Date Time Interrogation Session: 20250727235336
Implantable Pulse Generator Implant Date: 20240925

## 2024-01-18 ENCOUNTER — Ambulatory Visit: Payer: Self-pay | Admitting: Cardiology

## 2024-02-14 ENCOUNTER — Encounter: Payer: Self-pay | Admitting: Cardiovascular Disease

## 2024-02-14 ENCOUNTER — Ambulatory Visit: Attending: Cardiovascular Disease | Admitting: Cardiovascular Disease

## 2024-02-14 VITALS — BP 130/62 | HR 48 | Ht 68.0 in | Wt 165.0 lb

## 2024-02-14 DIAGNOSIS — E785 Hyperlipidemia, unspecified: Secondary | ICD-10-CM | POA: Diagnosis not present

## 2024-02-14 DIAGNOSIS — I480 Paroxysmal atrial fibrillation: Secondary | ICD-10-CM

## 2024-02-14 MED ORDER — ROSUVASTATIN CALCIUM 20 MG PO TABS: 20.0000 mg | ORAL_TABLET | Freq: Every day | ORAL | 3 refills | Status: AC

## 2024-02-14 NOTE — Patient Instructions (Signed)
 Medication Instructions:  Your physician has recommended you make the following change in your medication:   -Increase rosuvastatin  (Crestor ) to 20mg  once daily.  *If you need a refill on your cardiac medications before your next appointment, please call your pharmacy*  Lab Work: Your physician recommends that you return for lab work in: 3 months for FASTING Lipid/liver panel  If you have labs (blood work) drawn today and your tests are completely normal, you will receive your results only by: MyChart Message (if you have MyChart) OR A paper copy in the mail If you have any lab test that is abnormal or we need to change your treatment, we will call you to review the results.   Follow-Up: At [facility], you and your health needs are our priority.  As part of our continuing mission to provide you with exceptional heart care, our providers are all part of one team.  This team includes your primary Cardiologist (physician) and Advanced Practice Providers or APPs (Physician Assistants and Nurse Practitioners) who all work together to provide you with the care you need, when you need it.  Your next appointment:   12 month(s)  Provider:   Dorn Lesches, MD   We recommend signing up for the patient portal called MyChart.  Sign up information is provided on this After Visit Summary.  MyChart is used to connect with patients for Virtual Visits (Telemedicine).  Patients are able to view lab/test results, encounter notes, upcoming appointments, etc.  Non-urgent messages can be sent to your provider as well.   To learn more about what you can do with MyChart, go to [email or web address].

## 2024-02-14 NOTE — Assessment & Plan Note (Signed)
 History of hyperlipidemia on low-dose atorvastatin with lipid profile performed 05/31/2023 revealing total cholesterol 204, LDL 128 and HDL of 54.  He has not a goal for primary prevention.  Going to increase his rosuvastatin  from 10 to 20 mg a day and we will recheck a lipid liver profile in 3 months.

## 2024-02-14 NOTE — Assessment & Plan Note (Signed)
 History of PAF having had a loop recorder implanted by Dr. Cindie 02/23/2023 the hope of not restarting Eliquis  however he did have recurrent A-fib and currently is on Eliquis  oral anticoagulation maintaining sinus rhythm.  He wishes to explore the possibility of Watchman device implantation to be able to come off Eliquis .  I am referring him back to Dr. Cindie to discuss this.

## 2024-02-14 NOTE — Progress Notes (Signed)
 02/14/2024 Travis Hendricks   Nov 15, 1940  [id number]  Primary Physician Tisovec, Charlie ORN, MD Primary Cardiologist: Dorn JINNY Lesches MD FACP, [place], [place], [place]  HPI:  Travis Hendricks is a 84 y.o.   thin and fit appearing married Caucasian male father of 3 daughters, grandfather of 4 grandchildren who I last saw in the office 02/15/2023.  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  He was referred because of recent recent episode of PAF.  This was diagnosed during an ER visit 12/18/2022 when he was found to be in A-fib/a flutter and begun on Eliquis .  Apparently had had a GI illness for a week and a half prior to that with diarrhea and continue to play pickle ball.  He is very active.  His risk factors include treated hypertension and hyperlipidemia.  He had a 2D echo performed 01/04/2023 that revealed normal LV systolic function, and a moderately dilated left.  There were no significant valvular abnormalities.  A 2-week Zio patch showed short runs of SVT but no other arrhythmias.  He ultimately had a loop recorder implanted by Dr. Cindie 02/23/2023 with the hope of staying off Eliquis  however he was found to have recurrent A-fib and now is back on Eliquis  oral anticoagulation.  He is very active, he sales and plays pickle ball daily without symptoms.  Since I saw him a year ago he has had recurrent A-fib on Eliquis .  He wishes to reexplore the possibility of the Watchman device to be able to come off Eliquis  oral anticoagulation.  Current Meds  Medication Sig   apixaban  (ELIQUIS ) 5 MG TABS tablet Take 1 tablet (5 mg total) by mouth 2 (two) times daily.   Ascorbic Acid (VITAMIN C) 1000 MG tablet Take 1,000 mg by mouth daily.   benzonatate  (TESSALON ) 200 MG capsule as needed for cough.   Glucosamine-Chondroitin 166.7-133.3 MG CAPS Take 2 tablets by mouth every morning.   losartan (COZAAR) 25 MG tablet Take 25 mg by mouth daily.   MAGNESIUM PO Take 1 tablet by mouth at bedtime.   meloxicam  (MOBIC) 7.5 MG tablet 1 tablet Orally twice daily with food; Duration: 15 days   metoprolol  succinate (TOPROL -XL) 25 MG 24 hr tablet Take 0.5 tablets (12.5 mg total) by mouth daily.   Probiotic Product (PROBIOTIC PO) Take by mouth daily. Seed   rosuvastatin  (CRESTOR ) 10 MG tablet Take 10 mg by mouth at bedtime.   tadalafil (CIALIS) 20 MG tablet Take 20 mg by mouth daily as needed.   traMADol  (ULTRAM ) 50 MG tablet Take 1 tablet (50 mg total) by mouth every 8 (eight) hours as needed for moderate pain or severe pain. (Patient taking differently: Take 50 mg by mouth as needed for moderate pain (pain score 4-6) or severe pain (pain score 7-10).)   TURMERIC PO Take 1 tablet by mouth daily.   VITAMIN D, CHOLECALCIFEROL, PO Take 1 tablet by mouth every morning.   vitamin E 45 MG (100 UNITS) capsule Taking 1 tablet by mouth daily     Allergies  Allergen Reactions   Other Nausea And Vomiting    Hard Nuts   Peanut Oil     Other Reaction(s): Hard Nuts- not allergic to peanuts    Social History   Socioeconomic History   Marital status: Married    Spouse name: [place]   Number of children: 3   Years of education: Not on file   Highest education level: Not on file  Occupational  History    Comment: retired  Tobacco Use   Smoking status: Never   Smokeless tobacco: Never  Vaping Use   Vaping status: Never Used  Substance and Sexual Activity   Alcohol  use: Not Currently    Comment: occasiona;   Drug use: No   Sexual activity: Yes  Other Topics Concern   Not on file  Social History Narrative   Not on file   Social Drivers of Health   Financial Resource Strain: Not on file  Food Insecurity: No Food Insecurity (02/09/2022)   Hunger Vital Sign    Worried About Running Out of Food in the Last Year: Never true    Ran Out of Food in the Last Year: Never true  Transportation Needs: No Transportation Needs (02/09/2022)   PRAPARE - Administrator, Civil Service (Medical): No     Lack of Transportation (Non-Medical): No  Physical Activity: Not on file  Stress: Not on file  Social Connections: Not on file  Intimate Partner Violence: Not on file     Review of Systems: General: negative for chills, fever, night sweats or weight changes.  Cardiovascular: negative for chest pain, dyspnea on exertion, edema, orthopnea, palpitations, paroxysmal nocturnal dyspnea or shortness of breath Dermatological: negative for rash Respiratory: negative for cough or wheezing Urologic: negative for hematuria Abdominal: negative for nausea, vomiting, diarrhea, bright red blood per rectum, melena, or hematemesis Neurologic: negative for visual changes, syncope, or dizziness All other systems reviewed and are otherwise negative except as noted above.    Blood pressure 130/62, pulse (!) 48, height 5' 8 (1.727 m), weight 165 lb (74.8 kg), SpO2 98%.  General appearance: alert and no distress Neck: no adenopathy, no carotid bruit, no JVD, supple, symmetrical, trachea midline, and thyroid  not enlarged, symmetric, no tenderness/mass/nodules Lungs: clear to auscultation bilaterally Heart: regular rate and rhythm, S1, S2 normal, no murmur, click, rub or gallop Extremities: extremities normal, atraumatic, no cyanosis or edema Pulses: 2+ and symmetric Skin: Skin color, texture, turgor normal. No rashes or lesions Neurologic: Grossly normal  EKG not performed today      ASSESSMENT AND PLAN:   HYPERLIPIDEMIA History of hyperlipidemia on low-dose atorvastatin with lipid profile performed 05/31/2023 revealing total cholesterol 204, LDL 128 and HDL of 54.  He has not a goal for primary prevention.  Going to increase his rosuvastatin  from 10 to 20 mg a day and we will recheck a lipid liver profile in 3 months.  Paroxysmal atrial fibrillation (HCC) History of PAF having had a loop recorder implanted by Dr. Cindie 02/23/2023 the hope of not restarting Eliquis  however he did have recurrent A-fib  and currently is on Eliquis  oral anticoagulation maintaining sinus rhythm.  He wishes to explore the possibility of Watchman device implantation to be able to come off Eliquis .  I am referring him back to Dr. Cindie to discuss this.     Dorn DOROTHA Lesches MD FACP,FACC,FAHA, [facility] 02/14/2024 2:19 PM

## 2024-02-15 ENCOUNTER — Ambulatory Visit (INDEPENDENT_AMBULATORY_CARE_PROVIDER_SITE_OTHER): Payer: Self-pay

## 2024-02-15 DIAGNOSIS — I4891 Unspecified atrial fibrillation: Secondary | ICD-10-CM | POA: Diagnosis not present

## 2024-02-15 LAB — CUP PACEART REMOTE DEVICE CHECK
Date Time Interrogation Session: 20250827234041
Implantable Pulse Generator Implant Date: 20240925

## 2024-02-16 ENCOUNTER — Ambulatory Visit: Payer: Self-pay | Admitting: Cardiology

## 2024-02-29 NOTE — Progress Notes (Signed)
 Remote Loop Recorder Transmission

## 2024-03-18 ENCOUNTER — Ambulatory Visit (INDEPENDENT_AMBULATORY_CARE_PROVIDER_SITE_OTHER): Payer: Self-pay

## 2024-03-18 DIAGNOSIS — I4891 Unspecified atrial fibrillation: Secondary | ICD-10-CM | POA: Diagnosis not present

## 2024-03-18 LAB — CUP PACEART REMOTE DEVICE CHECK
Date Time Interrogation Session: 20250928234606
Implantable Pulse Generator Implant Date: 20240925

## 2024-03-20 NOTE — Progress Notes (Signed)
 Remote Loop Recorder Transmission

## 2024-03-21 NOTE — Progress Notes (Signed)
 Remote Loop Recorder Transmission

## 2024-03-22 ENCOUNTER — Ambulatory Visit: Payer: Self-pay | Admitting: Cardiology

## 2024-04-16 ENCOUNTER — Telehealth: Payer: Self-pay

## 2024-04-16 DIAGNOSIS — K080 Exfoliation of teeth due to systemic causes: Secondary | ICD-10-CM | POA: Diagnosis not present

## 2024-04-16 NOTE — Telephone Encounter (Signed)
 Left voicemail for patient to return call to reschedule upcoming watchman eval with Dr. Cindie. Will offer with Dr. Almetta.

## 2024-04-17 NOTE — Telephone Encounter (Signed)
 Spoke with patient and wife. Offered to reschedule appointment with Dr. Almetta for Watchman evaluation due to Dr. Hiram depature. They were agreeable.   Arranged OV with Dr. Almetta 11/13 at 11:15 AM.

## 2024-04-18 ENCOUNTER — Ambulatory Visit: Payer: Self-pay | Admitting: Cardiology

## 2024-04-18 ENCOUNTER — Ambulatory Visit (INDEPENDENT_AMBULATORY_CARE_PROVIDER_SITE_OTHER): Payer: Self-pay

## 2024-04-18 ENCOUNTER — Ambulatory Visit: Admitting: Cardiology

## 2024-04-18 DIAGNOSIS — I4891 Unspecified atrial fibrillation: Secondary | ICD-10-CM | POA: Diagnosis not present

## 2024-04-18 LAB — CUP PACEART REMOTE DEVICE CHECK
Date Time Interrogation Session: 20251029233944
Implantable Pulse Generator Implant Date: 20240925

## 2024-04-23 NOTE — Progress Notes (Signed)
 Remote Loop Recorder Transmission

## 2024-05-02 ENCOUNTER — Encounter: Payer: Self-pay | Admitting: Student in an Organized Health Care Education/Training Program

## 2024-05-02 ENCOUNTER — Ambulatory Visit
Attending: Student in an Organized Health Care Education/Training Program | Admitting: Student in an Organized Health Care Education/Training Program

## 2024-05-02 VITALS — BP 143/75 | HR 60 | Ht 68.0 in | Wt 164.0 lb

## 2024-05-02 DIAGNOSIS — I480 Paroxysmal atrial fibrillation: Secondary | ICD-10-CM | POA: Diagnosis not present

## 2024-05-02 NOTE — Patient Instructions (Addendum)
 Medication Instructions:  Your physician recommends that you continue on your current medications as directed. Please refer to the Current Medication list given to you today.  *If you need a refill on your cardiac medications before your next appointment, please call your pharmacy*  Lab Work: None ordered.  If you have labs (blood work) drawn today and your tests are completely normal, you will receive your results only by: MyChart Message (if you have MyChart) OR A paper copy in the mail If you have any lab test that is abnormal or we need to change your treatment, we will call you to review the results.  Testing/Procedures: WATCHMAN CT INSTRUCTIONS: Your WATCHMAN CT will be scheduled at the [facility] ([address] You will be called to confirm appointment date and time.  The day of your CT appointment, please use the FREE valet parking offered at the [facility] entrance (encouraged to control the heart rate for the test). You will check in on the first floor then proceed to the second floor to complete the registration process and for CT scanning.   Please follow these instructions carefully:  Hold all erectile dysfunction medications at least 3 days (72 hrs) prior to test.  On the Night Before the Test: Be sure to Drink plenty of water. Do not consume any caffeinated/decaffeinated beverages or chocolate 12 hours prior to your test. Do not take any antihistamines 12 hours prior to your test.  On the Day of the Test: Drink plenty of water, but do not eat or drink 1 hour prior to your scheduled CT appointment time. You may take your regular medications prior to the test.  HOLD Furosemide/Hydrochlorothiazide morning of the test. Please wear underwire-free bra if available      After the Test: Drink plenty of water. After receiving IV contrast, you may experience a mild flushed feeling. This is normal. On occasion, you may  experience a mild rash up to 24 hours after the test. This is not dangerous. If this occurs, you can take Benadryl 25 mg and increase your fluid intake. If you experience trouble breathing, this can be serious. If it is severe call 911 IMMEDIATELY. If it is mild, please call our office. If you take any of these medications: Glipizide/Metformin, Avandament, Glucavance, please do not take 48 hours after completing test unless otherwise instructed.  Once we have confirmed authorization from your insurance company, you will be called to set up a date and time for your test.   For non-scheduling related questions/concerns about your CT scan, please contact the cardiac imaging nurses: Camie Shutter, Cardiac Imaging Nurse Navigator Chantal Requena, Cardiac Imaging Nurse Navigator [facility] Direct Office Dial: [phone]   For scheduling needs, including cancellations and rescheduling, please call Brittany, [phone].  The Watchman Nurse Navigator will contact you after your CT to discuss results and schedule your procedure.    Thank you!    You will be scheduled for Watchman 06/27/2024.  Someone will  be in contact.   Follow-Up: At [facility], you and your health needs are our priority.  As part of our continuing mission to provide you with exceptional heart care, our providers are all part of one team.  This team includes your primary Cardiologist (physician) and Advanced Practice Providers or APPs (Physician Assistants and Nurse Practitioners) who all work together to provide you with the care you need, when you need it.  Your next  appointment:   To be scheduled

## 2024-05-02 NOTE — Progress Notes (Signed)
 Cardiology Office Note   Date: 05/02/24 ID:  Travis Hendricks, Travis Hendricks 26-Nov-1940, MRN [id number] PCP: Vernadine Charlie ORN, MD  [facility] Providers Cardiologist:  None Electrophysiologist:  Donnice DELENA Primus, MD   History of Present Illness Travis Hendricks is a 83 y.o. male with paroxysmal AF/AFL, HTN and HLD who presents for LAAO evaluation.   History of Present Illness He has a history of atrial fibrillation and atrial arrhythmias, with a loop monitor previously placed for monitoring. An initial episode of atrial fibrillation occurred in early 2025, associated with a feeling of passing out. A subsequent episode almost a year ago led to the resumption of blood thinners. Since Nov 15, 2023, no atrial fibrillation episodes have been recorded according to the loop monitor.  His blood pressure has been fluctuating, with morning readings as high as 160/109 mmHg and afternoon readings around 130/70 mmHg. He attributes some of the increase to dietary salt intake, specifically mentioning pretzels with extra salt, which he has since stopped consuming. No recent changes in medication have been made.  No history of stroke, diabetes, heart attacks, or heart failure. His resting heart rate is typically in the low 40s, and he has experienced it dropping to 36-38 bpm.  ROS: none   Studies Reviewed  ECG review 09/13/23: SB 55, PR 196, QRS 130, QT/c 438/419 12/18/22: AF/RVR 115, QRS 134, QT/c 372/515 12/18/22: AF/RVR 126, QRS 136, QT/c 342/496  TTE Result date: 01/04/23  1. Left ventricular ejection fraction, by estimation, is 55%. The left  ventricle has normal function. The left ventricle has no regional wall  motion abnormalities. Left ventricular diastolic parameters are consistent  with Grade I diastolic dysfunction  (impaired relaxation). The average left ventricular global longitudinal  strain is -17.7 %. The global longitudinal strain is normal.   2. Right ventricular systolic  function is mildly reduced. The right  ventricular size is mildly enlarged.   3. Left atrial size was moderately dilated.   4. Right atrial size was moderately dilated.   5. The mitral valve is normal in structure. Mild mitral valve  regurgitation. No evidence of mitral stenosis.   6. The aortic valve is tricuspid. There is mild calcification of the  aortic valve. There is mild thickening of the aortic valve. Aortic valve  regurgitation is not visualized. Aortic valve sclerosis is present, with  no evidence of aortic valve stenosis.   7. Aortic dilatation noted. There is mild dilatation of the ascending  aorta, measuring 38 mm.   8. The inferior vena cava is normal in size with greater than 50%  respiratory variability, suggesting right atrial pressure of 3 mmHg.   Risk Assessment/Calculations  CHA2DS2-VASc Score = 3  This indicates a 3.2% annual risk of stroke. The patient's score is based upon: CHF History: 0 HTN History: 1 Diabetes History: 0 Stroke History: 0 Vascular Disease History: 0 Age Score: 2 Gender Score: 0  Physical Exam VS:  BP (!) 143/75   Pulse 60   Ht 5' 8 (1.727 m)   Wt 164 lb (74.4 kg)   SpO2 95%   BMI 24.94 kg/m       Wt Readings from Last 3 Encounters:  05/02/24 164 lb (74.4 kg)  02/14/24 165 lb (74.8 kg)  09/13/23 165 lb 12.8 oz (75.2 kg)    GEN: Well nourished, well developed in no acute distress CARDIAC: RRR, no murmurs, rubs, gallops RESPIRATORY:  Clear to auscultation without rales, wheezing or rhonchi  EXTREMITIES:  No edema;  No deformity   ASSESSMENT AND PLAN Travis Hendricks is a 83 y.o. male with paroxysmal AF/AFL, HTN and HLD who presents for LAAO evaluation.    Paroxysmal AF NYHA class I  I reviewed Travis Hendricks AF history and he has not had any episodes in the past year.  He had AF/RVR in 11/2022 without any symptomatic recurrences.  He had a strong preference to come off of OAC and he had no symptomatic recurrences so his apixaban   was discontinued and MDT ILR was implanted for ongoing arrhythmia monitoring.  He unfortunately had AF recurrence on ILR and OAC was resumed.  I reviewed his ILR monitor back to March with no arrhythmia recurrence.  We discussed different options for management of both his AF and associated stroke risk.  He currently is CHA2DS2-VASc of 3 with a 3.2% annual risk of stroke.  He would like to discontinue OAC.  Review of his ECGs all consistent with AF with RVR.  We consider 3 options for management.  The first option would be stopping anticoagulation and waiting until we see any arrhythmia recurrence.  Benefit of this is to avoid procedures but the downside is that he could have an ischemic event as his first presentation of recurrent AF although this is not high likelihood.  The second option would be considering ablation and monitoring for any arrhythmia recurrence afterwards.  If no arrhythmia recurrence within a year then could consider coming off OAC again.  The third option would be proceeding with Watchman implant with the plan for discontinuation of OAC after 6 months and continuing aspirin thereafter.  He has had no serious bleeding events on apixaban  up to this point.  His main concern is the associated stroke risk and without any significant burden in the past 6 months he is not interested in ablation which is reasonable.  He does not want the possible risk of stroke even with a low burden of AF and not having in the past 6 months.  We reviewed the risk, benefits and alternatives of left atrial appendage occlusion with Watchman including but not limited to vascular access injury, tenderness from insertion site, damage to the heart wall requiring drain and/or median sternotomy, dangerous arrhythmias, cardiac perforation or death.  He and his wife understand these risk and he is wanting to proceed.  They had vacation in late December and would like to plan for January.  I have seen Travis Hendricks in the  office today who is being considered for a Watchman left atrial appendage closure device. I believe they will benefit from this procedure given their history of atrial fibrillation, CHA2DS2-VASc score of 3 and unadjusted ischemic stroke rate of 3.2% per year. Unfortunately, the patient is not felt to be a long term anticoagulation candidate secondary to patient preference. The patient's chart has been reviewed and I feel that they would be a candidate for short term oral anticoagulation after Watchman implant.   It is my belief that after undergoing a LAA closure procedure, Travis Hendricks will not need long term anticoagulation which eliminates anticoagulation side effects and major bleeding risk.   Procedural risks for the Watchman implant have been reviewed with the patient including a 0.5% risk of stroke, <1% risk of perforation and <1% risk of device embolization. Other risks include bleeding, vascular damage, tamponade, worsening renal function, and death. The patient understands these risk and wishes to proceed.    The published clinical data on the safety and effectiveness of WATCHMAN  include but are not limited to the following: - Holmes DR, Jess BEARD, Sick P et al. for the PROTECT AF Investigators. Percutaneous closure of the left atrial appendage versus warfarin therapy for prevention of stroke in patients with atrial fibrillation: a randomised non-inferiority trial. Lancet 2009; 374: 534-42. GLENWOOD Jess BEARD, Doshi SK, Jonita VEAR Satchel D et al. on behalf of the PROTECT AF Investigators. Percutaneous Left Atrial Appendage Closure for Stroke Prophylaxis in Patients With Atrial Fibrillation 2.3-Year Follow-up of the PROTECT AF (Watchman Left Atrial Appendage System for Embolic Protection in Patients With Atrial Fibrillation) Trial. Circulation 2013; 127:720-729. - Alli O, Doshi S,  Kar S, Reddy VY, Sievert H et al. Quality of Life Assessment in the Randomized PROTECT AF (Percutaneous Closure of the Left  Atrial Appendage Versus Warfarin Therapy for Prevention of Stroke in Patients With Atrial Fibrillation) Trial of Patients at Risk for Stroke With Nonvalvular Atrial Fibrillation. J Am Coll Cardiol 2013; 61:1790-8. GLENWOOD Satchel DR, Archer RAMAN, Price M, Whisenant B, Sievert H, Doshi S, Huber K, Reddy V. Prospective randomized evaluation of the Watchman left atrial appendage Device in patients with atrial fibrillation versus long-term warfarin therapy; the PREVAIL trial. Journal of the [employer] of Cardiology, Vol. 4, No. 1, 2014, 1-11. - Kar S, Doshi SK, Sadhu A, Horton R, Osorio J et al. Primary outcome evaluation of a next-generation left atrial appendage closure device: results from the PINNACLE FLX trial. Circulation 2021;143(18)1754-1762.   After today's visit with the patient which was dedicated solely for shared decision making visit regarding LAA closure device, the patient decided to proceed with the LAA appendage closure procedure. Prior to the procedure, I would like to obtain a gated CT scan of the chest with contrast timed for PV/LA visualization.   HAS-BLED score: 2 Hypertension Yes  Abnormal renal and liver function (Dialysis, transplant, Cr >2.26 mg/dL /Cirrhosis or Bilirubin >2x Normal or AST/ALT/AP >3x Normal) No  Stroke No  Bleeding No  Labile INR (Unstable/high INR) No  Elderly (>65) Yes  Drugs or alcohol  (>= 8 drinks/week, anti-plt or NSAID) No   CHA2DS2-VASc Score = 3  The patient's score is based upon: CHF History: 0 HTN History: 1 Diabetes History: 0 Stroke History: 0 Vascular Disease History: 0 Age Score: 2 Gender Score: 0  NYHA class I  Procedure details:  Procedure date: 06/28/23 Imaging: TEE OAC: Continue Eliquis  5 mg bid, hold 06/28/23 AM dose Post procedure: continue Eliquis  x 6 months then transition to monotx ASA 81 mg daily  A total of 50 minutes was spent preparing for the patient, reviewing history, performing exam, document encounter, coordinating care  and counseling the patient. 33 minutes was spent with direct patient care.   Signed,  Donnice DELENA Primus, MD    05/03/2024 6:41 AM

## 2024-05-02 NOTE — Progress Notes (Unsigned)
  Cardiology Office Note   Date:  05/02/2024  ID:  Travis Hendricks, DOB 25-Jan-1941, MRN [id number] PCP: Vernadine Charlie ORN, MD  [facility] Providers Cardiologist:  None Electrophysiologist:  OLE ONEIDA HOLTS, MD { Click to update primary MD,subspecialty MD or APP then REFRESH:1}    History of Present Illness Travis Hendricks is a 83 y.o. male ***  ROS: ***  Studies Reviewed      *** Risk Assessment/Calculations {Does this patient have ATRIAL FIBRILLATION?:[phone]} The patient's 1st BP is elevated (>139/89)*** Repeat BP and {Click to enter a 2nd BP Refresh Note  :1}       Physical Exam VS:  BP (!) 143/75   Pulse 60   Ht 5' 8 (1.727 m)   Wt 164 lb (74.4 kg)   SpO2 95%   BMI 24.94 kg/m        Wt Readings from Last 3 Encounters:  05/02/24 164 lb (74.4 kg)  02/14/24 165 lb (74.8 kg)  09/13/23 165 lb 12.8 oz (75.2 kg)    GEN: Well nourished, well developed in no acute distress NECK: No JVD; No carotid bruits CARDIAC: ***RRR, no murmurs, rubs, gallops RESPIRATORY:  Clear to auscultation without rales, wheezing or rhonchi  ABDOMEN: Soft, non-tender, non-distended EXTREMITIES:  No edema; No deformity   ASSESSMENT AND PLAN ***    {Are you ordering a CV Procedure (e.g. stress test, cath, DCCV, TEE, etc)?   Press F2        :[id number]}  Dispo: ***  Signed, Donnice DELENA Primus, MD

## 2024-05-03 ENCOUNTER — Telehealth: Payer: Self-pay

## 2024-05-03 LAB — BASIC METABOLIC PANEL WITH GFR
BUN/Creatinine Ratio: 16 (ref 10–24)
BUN: 20 mg/dL (ref 8–27)
CO2: 23 mmol/L (ref 20–29)
Calcium: 9.6 mg/dL (ref 8.6–10.2)
Chloride: 103 mmol/L (ref 96–106)
Creatinine, Ser: 1.26 mg/dL (ref 0.76–1.27)
Glucose: 87 mg/dL (ref 70–99)
Potassium: 4.8 mmol/L (ref 3.5–5.2)
Sodium: 140 mmol/L (ref 134–144)
eGFR: 57 mL/min/1.73 — ABNORMAL LOW (ref >59)

## 2024-05-03 NOTE — Telephone Encounter (Signed)
 Spoke with patient. Arranged pre-watchman CT 11/25. He had recent BMET.  Explained Dr. Almetta is not performing Watchman 06/27/24. Discussed possible Watchman date of 12/18 or 1/15. Patient leaving for [place] around 12/21. He is then leaving for [facility] 07/20/24. Advised would confirm date pending Watchman CT scan results.

## 2024-05-14 ENCOUNTER — Ambulatory Visit (HOSPITAL_COMMUNITY)
Admission: RE | Admit: 2024-05-14 | Discharge: 2024-05-14 | Disposition: A | Discharge status: Home / Self Care | Source: Ambulatory Visit | Attending: Internal Medicine | Admitting: Internal Medicine

## 2024-05-14 DIAGNOSIS — I480 Paroxysmal atrial fibrillation: Secondary | ICD-10-CM | POA: Diagnosis not present

## 2024-05-14 MED ORDER — IOHEXOL 350 MG/ML SOLN
80.0000 mL | Freq: Once | INTRAVENOUS | Status: AC | PRN
  Administered 2024-05-14: 80 mL via INTRAVENOUS

## 2024-05-15 ENCOUNTER — Telehealth: Payer: Self-pay

## 2024-05-15 NOTE — Telephone Encounter (Signed)
 Small chicken wing Max 18/ AVG 16/ Depth 11 Likely use a 20mm device. More usable depth available if angle in. Inf/Mid TSP RAO 29 CAU 30

## 2024-05-19 ENCOUNTER — Ambulatory Visit

## 2024-05-21 ENCOUNTER — Other Ambulatory Visit: Payer: Self-pay | Admitting: Student in an Organized Health Care Education/Training Program

## 2024-05-22 ENCOUNTER — Other Ambulatory Visit: Payer: Self-pay | Admitting: Student in an Organized Health Care Education/Training Program

## 2024-05-22 ENCOUNTER — Ambulatory Visit: Attending: Student in an Organized Health Care Education/Training Program

## 2024-05-22 DIAGNOSIS — E785 Hyperlipidemia, unspecified: Secondary | ICD-10-CM | POA: Diagnosis not present

## 2024-05-23 ENCOUNTER — Ambulatory Visit: Payer: Self-pay | Admitting: Cardiovascular Disease

## 2024-05-23 LAB — HEPATIC FUNCTION PANEL
ALT: 24 IU/L (ref 0–44)
AST: 31 IU/L (ref 0–40)
Albumin: 4.2 g/dL (ref 3.7–4.7)
Alkaline Phosphatase: 67 IU/L (ref 48–129)
Bilirubin Total: 0.7 mg/dL (ref 0.0–1.2)
Bilirubin, Direct: 0.23 mg/dL (ref 0.00–0.40)
Total Protein: 6.7 g/dL (ref 6.0–8.5)

## 2024-05-23 LAB — CUP PACEART REMOTE DEVICE CHECK
Date Time Interrogation Session: 20251202233421
Implantable Pulse Generator Implant Date: 20240925

## 2024-05-23 LAB — LIPID PANEL
Chol/HDL Ratio: 3.1 ratio (ref 0.0–5.0)
Cholesterol, Total: 177 mg/dL (ref 100–199)
HDL: 57 mg/dL (ref >39)
LDL Chol Calc (NIH): 87 mg/dL (ref 0–99)
Triglycerides: 197 mg/dL — ABNORMAL HIGH (ref 0–149)
VLDL Cholesterol Cal: 33 mg/dL (ref 5–40)

## 2024-05-24 NOTE — Telephone Encounter (Signed)
 Per Dr. Almetta, Good to go, reviewed TruPlan and may be a difficult one with lack of depth but should be able to give it a go.   Left message for patient to return call to discuss LAAO date (07/05/23) and arrange pre-op OV.

## 2024-05-27 ENCOUNTER — Telehealth: Payer: Self-pay

## 2024-05-27 MED ORDER — LOSARTAN POTASSIUM 25 MG PO TABS: 25.0000 mg | ORAL_TABLET | Freq: Every day | ORAL | 2 refills | Status: AC

## 2024-05-27 NOTE — Telephone Encounter (Signed)
 Left second voicemail for patient to return call to schedule.

## 2024-05-27 NOTE — Telephone Encounter (Signed)
 Travis Hendricks

## 2024-05-29 ENCOUNTER — Other Ambulatory Visit: Payer: Self-pay

## 2024-05-29 DIAGNOSIS — I480 Paroxysmal atrial fibrillation: Secondary | ICD-10-CM

## 2024-05-29 NOTE — Addendum Note (Signed)
 Addended by: WAYLON EDSEL HERO on: 05/29/2024 02:02 PM   Modules accepted: Orders

## 2024-05-29 NOTE — Progress Notes (Signed)
 Remote Loop Recorder Transmission

## 2024-05-29 NOTE — Telephone Encounter (Addendum)
 Spoke with patient. Arranged LAAO 07/04/24 with Dr. Almetta at 0730 with report time of 0530. Patient is currently feel with with no symptoms. He is unchanged from OV with Dr. Almetta.   Will not arrange pre-procedure OV at this time as patient is feeling well. Patient will call the office if he develops illness, any symptoms of CP/SOB, goes to an urgent care or a hospital.   Patient will go for pre-procedure labs within 30 days of procedure.   Instructions will be sent via MyChart and will also be mailed per patient preference.

## 2024-06-12 ENCOUNTER — Ambulatory Visit: Payer: Self-pay | Admitting: Student in an Organized Health Care Education/Training Program

## 2024-06-17 LAB — CBC WITH DIFFERENTIAL/PLATELET
Basophils Absolute: 0.1 x10E3/uL (ref 0.0–0.2)
Basos: 1 %
EOS (ABSOLUTE): 0.1 x10E3/uL (ref 0.0–0.4)
Eos: 2 %
Hematocrit: 43.5 % (ref 37.5–51.0)
Hemoglobin: 14 g/dL (ref 13.0–17.7)
Immature Grans (Abs): 0 x10E3/uL (ref 0.0–0.1)
Immature Granulocytes: 0 %
Lymphocytes Absolute: 1.7 x10E3/uL (ref 0.7–3.1)
Lymphs: 25 %
MCH: 31.4 pg (ref 26.6–33.0)
MCHC: 32.2 g/dL (ref 31.5–35.7)
MCV: 98 fL — ABNORMAL HIGH (ref 79–97)
Monocytes Absolute: 0.6 x10E3/uL (ref 0.1–0.9)
Monocytes: 8 %
Neutrophils Absolute: 4.4 x10E3/uL (ref 1.4–7.0)
Neutrophils: 64 %
Platelets: 243 x10E3/uL (ref 150–450)
RBC: 4.46 x10E6/uL (ref 4.14–5.80)
RDW: 12.5 % (ref 11.6–15.4)
WBC: 6.8 x10E3/uL (ref 3.4–10.8)

## 2024-06-17 LAB — BASIC METABOLIC PANEL WITH GFR
BUN/Creatinine Ratio: 15 (ref 10–24)
BUN: 20 mg/dL (ref 8–27)
CO2: 26 mmol/L (ref 20–29)
Calcium: 9.7 mg/dL (ref 8.6–10.2)
Chloride: 106 mmol/L (ref 96–106)
Creatinine, Ser: 1.33 mg/dL — ABNORMAL HIGH (ref 0.76–1.27)
Glucose: 120 mg/dL — ABNORMAL HIGH (ref 70–99)
Potassium: 4.7 mmol/L (ref 3.5–5.2)
Sodium: 146 mmol/L — ABNORMAL HIGH (ref 134–144)
eGFR: 53 mL/min/1.73 — ABNORMAL LOW

## 2024-06-18 ENCOUNTER — Ambulatory Visit: Payer: Self-pay

## 2024-06-19 ENCOUNTER — Ambulatory Visit

## 2024-06-22 ENCOUNTER — Ambulatory Visit: Attending: Student in an Organized Health Care Education/Training Program

## 2024-06-22 DIAGNOSIS — I480 Paroxysmal atrial fibrillation: Secondary | ICD-10-CM | POA: Diagnosis not present

## 2024-06-24 LAB — CUP PACEART REMOTE DEVICE CHECK
Date Time Interrogation Session: 20260102233015
Implantable Pulse Generator Implant Date: 20240925

## 2024-06-25 ENCOUNTER — Ambulatory Visit: Payer: Self-pay | Admitting: Student in an Organized Health Care Education/Training Program

## 2024-06-27 ENCOUNTER — Telehealth: Payer: Self-pay

## 2024-06-27 NOTE — Telephone Encounter (Signed)
 Left voicemail for patient to return call to review LAAO instructions.

## 2024-06-27 NOTE — Progress Notes (Signed)
 Remote Loop Recorder Transmission

## 2024-06-28 NOTE — Telephone Encounter (Signed)
 Left second voicemail for patient to return call to confirm LAAO procedure next week and review instructions.

## 2024-07-01 NOTE — Telephone Encounter (Signed)
 Confirmed procedure date of 07/04/2024. Confirmed arrival time of 0530 for procedure time at 0730. Reviewed pre-procedure instructions with patient. Contrast allergy? No PPM or defibrillator? No The patient understands to call if questions/concerns arise prior to procedure. The patient was grateful for call and agreed with plan.

## 2024-07-04 ENCOUNTER — Encounter (HOSPITAL_COMMUNITY): Payer: Self-pay | Admitting: Student in an Organized Health Care Education/Training Program

## 2024-07-04 ENCOUNTER — Inpatient Hospital Stay (HOSPITAL_COMMUNITY): Admitting: Anesthesiology

## 2024-07-04 ENCOUNTER — Inpatient Hospital Stay (HOSPITAL_COMMUNITY)

## 2024-07-04 ENCOUNTER — Inpatient Hospital Stay (HOSPITAL_COMMUNITY)
Admission: RE | Disposition: A | Discharge status: Home / Self Care | Payer: Self-pay | Source: Home / Self Care | Attending: Student in an Organized Health Care Education/Training Program

## 2024-07-04 ENCOUNTER — Other Ambulatory Visit: Payer: Self-pay

## 2024-07-04 ENCOUNTER — Inpatient Hospital Stay (HOSPITAL_COMMUNITY)
Admission: RE | Admit: 2024-07-04 | Discharge: 2024-07-04 | DRG: 274 | Disposition: A | Discharge status: Home / Self Care | Attending: Student in an Organized Health Care Education/Training Program | Admitting: Student in an Organized Health Care Education/Training Program

## 2024-07-04 DIAGNOSIS — Z7982 Long term (current) use of aspirin: Secondary | ICD-10-CM

## 2024-07-04 DIAGNOSIS — N183 Chronic kidney disease, stage 3 unspecified: Secondary | ICD-10-CM

## 2024-07-04 DIAGNOSIS — Z006 Encounter for examination for normal comparison and control in clinical research program: Secondary | ICD-10-CM | POA: Diagnosis not present

## 2024-07-04 DIAGNOSIS — Z7902 Long term (current) use of antithrombotics/antiplatelets: Secondary | ICD-10-CM

## 2024-07-04 DIAGNOSIS — I480 Paroxysmal atrial fibrillation: Principal | ICD-10-CM | POA: Diagnosis present

## 2024-07-04 DIAGNOSIS — I129 Hypertensive chronic kidney disease with stage 1 through stage 4 chronic kidney disease, or unspecified chronic kidney disease: Secondary | ICD-10-CM

## 2024-07-04 DIAGNOSIS — D6869 Other thrombophilia: Secondary | ICD-10-CM | POA: Diagnosis present

## 2024-07-04 DIAGNOSIS — I10 Essential (primary) hypertension: Secondary | ICD-10-CM | POA: Diagnosis present

## 2024-07-04 DIAGNOSIS — Z95818 Presence of other cardiac implants and grafts: Principal | ICD-10-CM

## 2024-07-04 DIAGNOSIS — E785 Hyperlipidemia, unspecified: Secondary | ICD-10-CM | POA: Diagnosis present

## 2024-07-04 HISTORY — PX: TRANSESOPHAGEAL ECHOCARDIOGRAM (CATH LAB): EP1270

## 2024-07-04 HISTORY — PX: LEFT ATRIAL APPENDAGE OCCLUSION: EP1229

## 2024-07-04 LAB — SURGICAL PCR SCREEN
MRSA, PCR: NEGATIVE
Staphylococcus aureus: NEGATIVE

## 2024-07-04 LAB — ECHO TEE

## 2024-07-04 LAB — POCT ACTIVATED CLOTTING TIME: Activated Clotting Time: 327 s

## 2024-07-04 LAB — TYPE AND SCREEN
ABO/RH(D): A POS
Antibody Screen: NEGATIVE

## 2024-07-04 LAB — ABO/RH: ABO/RH(D): A POS

## 2024-07-04 MED ORDER — PHENYLEPHRINE HCL-NACL 20-0.9 MG/250ML-% IV SOLN
INTRAVENOUS | Status: DC | PRN
  Administered 2024-07-04: 30 ug/min via INTRAVENOUS

## 2024-07-04 MED ORDER — ONDANSETRON HCL 4 MG/2ML IJ SOLN
INTRAMUSCULAR | Status: DC | PRN
  Administered 2024-07-04: 4 mg via INTRAVENOUS

## 2024-07-04 MED ORDER — SODIUM CHLORIDE 0.9 % IV SOLN: INTRAVENOUS | Status: DC

## 2024-07-04 MED ORDER — FENTANYL CITRATE (PF) 100 MCG/2ML IJ SOLN
INTRAMUSCULAR | Status: AC
  Filled 2024-07-04: qty 2

## 2024-07-04 MED ORDER — CHLORHEXIDINE GLUCONATE 0.12 % MT SOLN
15.0000 mL | Freq: Once | OROMUCOSAL | Status: AC
  Administered 2024-07-04: 15 mL via OROMUCOSAL
  Filled 2024-07-04: qty 15

## 2024-07-04 MED ORDER — LACTATED RINGERS IV SOLN: INTRAVENOUS | Status: DC

## 2024-07-04 MED ORDER — ROCURONIUM BROMIDE 10 MG/ML (PF) SYRINGE
PREFILLED_SYRINGE | INTRAVENOUS | Status: DC | PRN
  Administered 2024-07-04: 60 mg via INTRAVENOUS

## 2024-07-04 MED ORDER — IOHEXOL 350 MG/ML SOLN
INTRAVENOUS | Status: DC | PRN
  Administered 2024-07-04: 30 mL

## 2024-07-04 MED ORDER — PROPOFOL 10 MG/ML IV BOLUS
INTRAVENOUS | Status: DC | PRN
  Administered 2024-07-04: 90 mg via INTRAVENOUS
  Administered 2024-07-04: 30 mg via INTRAVENOUS
  Administered 2024-07-04: 20 mg via INTRAVENOUS

## 2024-07-04 MED ORDER — PROTAMINE SULFATE 10 MG/ML IV SOLN
INTRAVENOUS | Status: DC | PRN
  Administered 2024-07-04: 30 mg via INTRAVENOUS

## 2024-07-04 MED ORDER — ASPIRIN 81 MG PO TBEC: 81.0000 mg | DELAYED_RELEASE_TABLET | Freq: Every day | ORAL | Status: AC

## 2024-07-04 MED ORDER — PHENYLEPHRINE 80 MCG/ML (10ML) SYRINGE FOR IV PUSH (FOR BLOOD PRESSURE SUPPORT)
PREFILLED_SYRINGE | INTRAVENOUS | Status: DC | PRN
  Administered 2024-07-04: 80 ug via INTRAVENOUS

## 2024-07-04 MED ORDER — APIXABAN 5 MG PO TABS
5.0000 mg | ORAL_TABLET | Freq: Two times a day (BID) | ORAL | Status: DC
  Administered 2024-07-04: 5 mg via ORAL

## 2024-07-04 MED ORDER — CLOPIDOGREL BISULFATE 75 MG PO TABS: 75.0000 mg | ORAL_TABLET | Freq: Every day | ORAL | 1 refills | Status: AC

## 2024-07-04 MED ORDER — EPHEDRINE SULFATE-NACL 50-0.9 MG/10ML-% IV SOSY
PREFILLED_SYRINGE | INTRAVENOUS | Status: DC | PRN
  Administered 2024-07-04: 10 mg via INTRAVENOUS

## 2024-07-04 MED ORDER — HEPARIN SODIUM (PORCINE) 1000 UNIT/ML IJ SOLN
INTRAMUSCULAR | Status: DC | PRN
  Administered 2024-07-04: 15000 [IU] via INTRAVENOUS

## 2024-07-04 MED ORDER — HEPARIN (PORCINE) IN NACL 1000-0.9 UT/500ML-% IV SOLN
INTRAVENOUS | Status: DC | PRN
  Administered 2024-07-04 (×2): 500 mL

## 2024-07-04 MED ORDER — CEFAZOLIN SODIUM-DEXTROSE 2-4 GM/100ML-% IV SOLN
2.0000 g | INTRAVENOUS | Status: AC
  Administered 2024-07-04: 2 g via INTRAVENOUS
  Filled 2024-07-04: qty 100

## 2024-07-04 MED ORDER — APIXABAN 5 MG PO TABS
ORAL_TABLET | ORAL | Status: AC
  Filled 2024-07-04: qty 1

## 2024-07-04 MED ORDER — LIDOCAINE 2% (20 MG/ML) 5 ML SYRINGE
INTRAMUSCULAR | Status: DC | PRN
  Administered 2024-07-04: 60 mg via INTRAVENOUS

## 2024-07-04 MED ORDER — FENTANYL CITRATE (PF) 250 MCG/5ML IJ SOLN
INTRAMUSCULAR | Status: DC | PRN
  Administered 2024-07-04: 100 ug via INTRAVENOUS

## 2024-07-04 MED ORDER — FENTANYL CITRATE (PF) 100 MCG/2ML IJ SOLN: 25.0000 ug | INTRAMUSCULAR | Status: DC | PRN

## 2024-07-04 MED ORDER — DEXAMETHASONE SOD PHOSPHATE PF 10 MG/ML IJ SOLN
INTRAMUSCULAR | Status: DC | PRN
  Administered 2024-07-04: 5 mg via INTRAVENOUS

## 2024-07-04 NOTE — Transfer of Care (Signed)
 Immediate Anesthesia Transfer of Care Note  Patient: Travis Hendricks  Procedure(s) Performed: LEFT ATRIAL APPENDAGE OCCLUSION TRANSESOPHAGEAL ECHOCARDIOGRAM  Patient Location: PACU  Anesthesia Type:General  Level of Consciousness: awake and alert   Airway & Oxygen Therapy: Patient Spontanous Breathing  Post-op Assessment: Report given to RN  Post vital signs: Reviewed and stable  Last Vitals:  Vitals Value Taken Time  BP    Temp    Pulse 51 07/04/24 09:21  Resp 16 07/04/24 09:21  SpO2 95 % 07/04/24 09:21  Vitals shown include unfiled device data.  Last Pain:  Vitals:   07/04/24 0644  TempSrc:   PainSc: 0-No pain         Complications: No notable events documented.

## 2024-07-04 NOTE — Anesthesia Postprocedure Evaluation (Signed)
"   Anesthesia Post Note  Patient: Travis Hendricks  Procedure(s) Performed: LEFT ATRIAL APPENDAGE OCCLUSION TRANSESOPHAGEAL ECHOCARDIOGRAM     Patient location during evaluation: Cath Lab Anesthesia Type: General Level of consciousness: awake and alert Pain management: pain level controlled Vital Signs Assessment: post-procedure vital signs reviewed and stable Respiratory status: spontaneous breathing, nonlabored ventilation and respiratory function stable Cardiovascular status: stable and blood pressure returned to baseline Postop Assessment: no apparent nausea or vomiting Anesthetic complications: no   No notable events documented.  Last Vitals:  Vitals:   07/04/24 1115 07/04/24 1130  BP: 107/72 110/63  Pulse: (!) 40 (!) 42  Resp: 14 15  Temp:    SpO2: 99% 99%    Last Pain:  Vitals:   07/04/24 1045  TempSrc:   PainSc: 0-No pain                 Ellean Firman      "

## 2024-07-04 NOTE — Progress Notes (Addendum)
 Eliquis  was given at 0958. Patient's VSS. Patient walked to the bathroom and groin site is stable, clean/dry/intact. MD arrived to bedside to review discharge instructions and patient is ready to be discharged. MD also discussed chest discomfort and swollen throat with the patient. IV removed. AVS reviewed with patient and all questions answered. Patient wheeled to the front where his wife will drive him home.

## 2024-07-04 NOTE — Anesthesia Preprocedure Evaluation (Signed)
 "                                  Anesthesia Evaluation  Patient identified by MRN, date of birth, ID band Patient awake    Reviewed: Allergy & Precautions, NPO status , Patient's Chart, lab work & pertinent test results  History of Anesthesia Complications Negative for: history of anesthetic complications  Airway Mallampati: III  TM Distance: >3 FB Neck ROM: Full    Dental  (+) Teeth Intact, Dental Advisory Given   Pulmonary neg shortness of breath, neg sleep apnea, neg COPD, neg recent URI   breath sounds clear to auscultation       Cardiovascular hypertension, Pt. on medications and Pt. on home beta blockers (-) angina (-) Past MI and (-) CHF + dysrhythmias Atrial Fibrillation  Rhythm:Regular  1. Left ventricular ejection fraction, by estimation, is 55%. The left  ventricle has normal function. The left ventricle has no regional wall  motion abnormalities. Left ventricular diastolic parameters are consistent  with Grade I diastolic dysfunction  (impaired relaxation). The average left ventricular global longitudinal  strain is -17.7 %. The global longitudinal strain is normal.   2. Right ventricular systolic function is mildly reduced. The right  ventricular size is mildly enlarged.   3. Left atrial size was moderately dilated.   4. Right atrial size was moderately dilated.   5. The mitral valve is normal in structure. Mild mitral valve  regurgitation. No evidence of mitral stenosis.   6. The aortic valve is tricuspid. There is mild calcification of the  aortic valve. There is mild thickening of the aortic valve. Aortic valve  regurgitation is not visualized. Aortic valve sclerosis is present, with  no evidence of aortic valve stenosis.   7. Aortic dilatation noted. There is mild dilatation of the ascending  aorta, measuring 38 mm.   8. The inferior vena cava is normal in size with greater than 50%  respiratory variability, suggesting right atrial pressure of  3 mmHg.      Neuro/Psych neg Seizures  negative psych ROS   GI/Hepatic negative GI ROS, Neg liver ROS,,,  Endo/Other  negative endocrine ROS    Renal/GU CRFRenal diseaseLab Results      Component                Value               Date                      NA                       146 (H)             06/17/2024                K                        4.7                 06/17/2024                CO2                      26                  06/17/2024  GLUCOSE                  120 (H)             06/17/2024                BUN                      20                  06/17/2024                CREATININE               1.33 (H)            06/17/2024                CALCIUM                   9.7                 06/17/2024                GFR                      53.66 (L)           06/28/2011                EGFR                     53 (L)              06/17/2024                GFRNONAA                 50 (L)              12/18/2022                Musculoskeletal  (+) Arthritis ,    Abdominal   Peds  Hematology negative hematology ROS (+) Lab Results      Component                Value               Date                      WBC                      6.8                 06/17/2024                HGB                      14.0                06/17/2024                HCT                      43.5                06/17/2024                MCV  98 (H)              06/17/2024                PLT                      243                 06/17/2024              Anesthesia Other Findings   Reproductive/Obstetrics                              Anesthesia Physical Anesthesia Plan  ASA: 3  Anesthesia Plan: General   Post-op Pain Management: Minimal or no pain anticipated   Induction: Intravenous  PONV Risk Score and Plan: 2 and Ondansetron , Dexamethasone , Propofol  infusion and TIVA  Airway Management Planned: Oral  ETT  Additional Equipment: None  Intra-op Plan:   Post-operative Plan: Extubation in OR  Informed Consent: I have reviewed the patients History and Physical, chart, labs and discussed the procedure including the risks, benefits and alternatives for the proposed anesthesia with the patient or authorized representative who has indicated his/her understanding and acceptance.     Dental advisory given  Plan Discussed with: CRNA  Anesthesia Plan Comments:          Anesthesia Quick Evaluation  "

## 2024-07-04 NOTE — Op Note (Addendum)
" ° °  Procedure:  Watchman FLX Pro implant (CPT [id number])  Indication: Discontinue OAC  Attending: Adina Primus, MD   CHA2DS2-VASc Score = 3  This indicates a 3.2% annual risk of stroke. The patient's score is based upon: CHF History: 0 HTN History: 1 Diabetes History: 0 Stroke History: 0 Vascular Disease History: 0 Age Score: 2 Gender Score: 0  Procedure Summary: Vascular ultrasound and standard access needle used to access the right femoral vein.  A WATCHMAN TruSteer sheath (17 Fr OD) with VersaCross Connect was advanced to the mid inferior septum.  TEE demonstrated an intact atrial septum and no evidence of thrombus. Echocardiographic examination of the left atrial appendage was reviewed in multiple views. Maximum LAA ostial diameter 13 mm. Maximum LA depth 13 mm. Based upon these images a 20 mm WATCHMAN FLX Pro device was selected. Heparinization with an ACT >300 seconds. Successful inferior and mid transeptal catheterization using the VersaCross Connect radiofrequency needle with TEE and fluoroscopic guidance.   We placed the wire in the left upper pulmonary vein, Versa Cross sheath pulled back into RA, and ICE was advanced into the LA over the guidewire. The outer Watchman sheath was advanced over the RF wire to the mid LAA. Multiple ICE views obtained to exclude thrombus and determine sizing of the Watchman device. LAP 5 mmHg. A total of 1 L IVFs was given (500 cc pre procedure and an additional 500 cc intra-op). Contrast injection through the Watchman outer sheath in multiple views demonstrated a chicken wing morphology with significant trabeculations. Contast injections confirmed the TEE measurements.   The 20 mm WATCHMAN FLX Pro device was deployed. The device was well seated in its final position at the LAA ostium and was stable to multiple tug tests. There was no TEE evidence of leak and all of the PASS criteria were met (position, anchor, size, and seal). The device was compressed  15-20 % in 4 different ICE views. The WATCHMAN was released and the catheters were withdrawn to the right atrium. Final TEE examination revealed no effusion and a stable WATCHMAN position. Protamine  was given. Perclose proglide x2 utilized for hemostasis. Dermabond was applied over the incision.    Summary: 1. Successful implant of Watchman FLX Pro 20 mm device   Recommendations:  IP only procedure, discharge following admission  Continue apixaban  5 mg bid (has 2 weeks left) then start DAPT (ASA 81 mg daily+plavix  75 mg daily) until 6 mo f/u Discharge home if no issues CT scan in 6 weeks F/u to be scheduled   Donnice DELENA Primus, MD [facility]  Cardiac Electrophysiology "

## 2024-07-04 NOTE — Discharge Summary (Signed)
 "   Electrophysiology Discharge Summary   Patient ID: Travis Hendricks,  MRN: [id number], DOB/AGE: November 25, 1940 84 y.o.  Admit date: 07/04/2024 Discharge date: 07/04/2024  Primary Care Physician: Tisovec, Richard W, MD  Primary Cardiologist: Dorn Lesches, MD  Electrophysiologist: Donnice DELENA Primus, MD  Primary Discharge Diagnosis:  Paroxysmal Atrial Fibrillation Secondary hypercoagulable state  Poor candidacy for long term anticoagulation due to preference to avoid long-term oral anticoagulation  Secondary Discharge Diagnosis:  HTN  Procedures This Admission:  Watchman FLX Pro Implant, 20 mm  Brief HPI: Travis Hendricks is a 84 y.o. male with a history of Paroxysmal Atrial Fibrillation who was referred to Electrophysiology in the outpatient setting   Hospital Course:  The patient was admitted and underwent left atrial appendage occlusive device placement as above.  The patient was monitored in the post procedure setting and has done very well with no concerns. Given this, he/she is being considered for same day discharge later today. Groin site has been stable without evidence of hematoma or bleeding. Wound care and restrictions were reviewed with the patient.   The patient has been scheduled for post procedure follow up with Dr. Primus in approximately 6 weeks. They will restart Eliquis  (first dose given immediately post procedure, next dose 01/15 PM) this evening and continue until he runs out (he has 15 days left) then stop. At that time he will transition to ASA 81 mg daily and plavix  75 mg daily for 6 months. They will require dental SBE for 6 month post op and should refrain from dental work or cleanings for the first 45 days post implant. SBE to be RXd at follow up.   A repeat CT scan will be performed in approximately 60 days to ensure proper seal of the device.   Pt was admitted for planned inpatient only procedure.  Inpatient bed requested. Due to elevated census, pts  recovery time expired while in recovery area, prior to floor bed being ready. Discussed with MD and staff and felt appropriate to discharge patient from recovery area, given stable vitals, exam, and expiration of bed rest.   Physical Exam: Vitals:   07/04/24 1015 07/04/24 1030 07/04/24 1045 07/04/24 1100  BP: (!) 95/58 (!) 96/57 (!) 95/59 103/61  Pulse: (!) 40 (!) 38 (!) 39 (!) 38  Resp: 12 11 11    Temp:      TempSrc:      SpO2: 95% 97% 98% 97%  Weight:      Height:       GEN: Well nourished, well developed in no acute distress NECK: No JVD; No carotid bruits CARDIAC: Regular rate and rhythm, no murmurs, rubs, gallops RESPIRATORY:  Clear to auscultation without rales, wheezing or rhonchi  ABDOMEN: Soft, non-tender, non-distended EXTREMITIES:  No edema; No deformity. Groin site stable, Dermabond overlying incision. No hematoma or bruit.     Discharge Medications:  Allergies as of 07/04/2024       Reactions   Other Nausea And Vomiting   Hard Nuts-not allergic to peanuts         Medication List     TAKE these medications    acetaminophen  500 MG tablet Commonly known as: TYLENOL  Take 1,000 mg by mouth every 6 (six) hours as needed for moderate pain (pain score 4-6) or mild pain (pain score 1-3).   apixaban  5 MG Tabs tablet Commonly known as: Eliquis  Take 1 tablet (5 mg total) by mouth 2 (two) times daily.   aspirin  EC 81 MG tablet  Take 1 tablet (81 mg total) by mouth daily. Start taking once you run out of Eliquis .   clopidogrel  75 MG tablet Commonly known as: Plavix  Take 1 tablet (75 mg total) by mouth daily. Start taking after your run out of Eliquis    losartan  25 MG tablet Commonly known as: COZAAR  Take 1 tablet (25 mg total) by mouth daily.   MAGNESIUM PO Take 3 tablets by mouth at bedtime. L-threonate   meloxicam 7.5 MG tablet Commonly known as: MOBIC 1 tablet Orally twice daily with food; Duration: 15 days   metoprolol  succinate 25 MG 24 hr  tablet Commonly known as: TOPROL -XL Take 0.5 tablets (12.5 mg total) by mouth daily. What changed: when to take this   PROBIOTIC PO Take 2 tablets by mouth daily.   rosuvastatin  20 MG tablet Commonly known as: CRESTOR  Take 1 tablet (20 mg total) by mouth at bedtime.   tadalafil 20 MG tablet Commonly known as: CIALIS Take 20 mg by mouth daily as needed.   traMADol  50 MG tablet Commonly known as: ULTRAM  Take 1 tablet (50 mg total) by mouth every 8 (eight) hours as needed for moderate pain or severe pain.   TURMERIC PO Take 1 tablet by mouth daily.   vitamin C 1000 MG tablet Take 1,000 mg by mouth daily.   VITAMIN D (CHOLECALCIFEROL) PO Take 1 tablet by mouth every morning.   vitamin E 45 MG (100 UNITS) capsule Taking 1 tablet by mouth daily       Disposition:  Home with usual follow up as in AVS  Duration of Discharge Encounter: 20 minutes  Signed, Donnice DELENA Primus, MD  07/04/2024 11:23 AM    "

## 2024-07-04 NOTE — Discharge Instructions (Addendum)
 " WATCHMAN Procedure, Care After  Procedure MD: Dr. Almetta Olds Clinical Coordinator: Edsel Greek, RN and Rockie Redman, RN  This sheet gives you information about how to care for yourself after your procedure. Your health care provider may also give you more specific instructions. If you have problems or questions, contact your health care provider.  What can I expect after the procedure? After the procedure, it is common to have: Bruising around your puncture site. Tenderness around your puncture site. Tiredness (fatigue).  Medication instructions It is very important to continue to take your blood thinner as directed by your doctor after the Watchman procedure. Call your procedure doctors office with question or concerns. If you are on Coumadin  (warfarin), you will have your INR checked the week after your procedure, with a goal INR of 2.0 - 3.0. Please follow your medication instructions on your discharge summary. Only take the medications listed on your discharge paperwork.  Follow up You will be seen in 6 weeks after your procedure You will have a repeat CT scan or Echocardiogram approximately 8 weeks after your procedure mark to check your device You will follow up the MD/APP who performed your procedure 6 months after your procedure The Watchman Clinical Coordinator will check in with you from time to time, including 1 and 2 years after your procedure.  NO DENTAL CLEANINGS FOR 45 days. After that, you will require antibiotics for dental procedures the first 6 months.   Follow these instructions at home: Puncture site care  Follow instructions from your health care provider about how to take care of your puncture site. Make sure you: If present, leave stitches (sutures), skin glue, or adhesive strips in place.  If a large square bandage is present, this may be removed 24 hours after surgery.  Check your puncture site every day for signs of infection. Check  for: Redness, swelling, or pain. Fluid or blood. If your puncture site starts to bleed, lie down on your back, apply firm pressure to the area, and contact your health care provider. Warmth. Pus or a bad smell. Driving Do not drive yourself home if you received sedation Do not drive for at least 4 days after your procedure or however long your health care provider recommends. (Do not resume driving if you have previously been instructed not to drive for other health reasons.) Do not spend greater than 1 hour at a time in a car for the first 3 days. Stop and take a break with a 5 minute walk at least every hour.  Do not drive or use heavy machinery while taking prescription pain medicine.  Activity Avoid activities that take a lot of effort, including exercise, for at least 7 days after your procedure. For the first 3 days, avoid sitting for longer than one hour at a time.  Avoid alcoholic beverages, signing paperwork, or participating in legal proceedings for 24 hours after receiving sedation Do not lift anything that is heavier than 10 lb (4.5 kg) for one week.  No sexual activity for 1 week.  Return to your normal activities as told by your health care provider. Ask your health care provider what activities are safe for you. General instructions Take over-the-counter and prescription medicines only as told by your health care provider. Do not use any products that contain nicotine or tobacco, such as cigarettes and e-cigarettes. If you need help quitting, ask your health care provider. You may shower after 24 hours, but Do not take baths, swim,  or use a hot tub for 1 week.  Do not drink alcohol  for 24 hours after your procedure. Keep all follow-up visits as told by your health care provider. This is important. Dental Work: You will require antibiotics prior to any dental work, including cleanings, for 6 months after your Watchman implantation to help protect you from infection. After 6  months, antibiotics are no longer required. Contact a health care provider if: You have redness, mild swelling, or pain around your puncture site. You have soreness in your throat or at your puncture site that does not improve after several days You have fluid or blood coming from your puncture site that stops after applying firm pressure to the area. Your puncture site feels warm to the touch. You have pus or a bad smell coming from your puncture site. You have a fever. You have chest pain or discomfort that spreads to your neck, jaw, or arm. You are sweating a lot. You feel nauseous. You have a fast or irregular heartbeat. You have shortness of breath. You are dizzy or light-headed and feel the need to lie down. You have pain or numbness in the arm or leg closest to your puncture site. Get help right away if: Your puncture site suddenly swells. Your puncture site is bleeding and the bleeding does not stop after applying firm pressure to the area. These symptoms may represent a serious problem that is an emergency. Do not wait to see if the symptoms will go away. Get medical help right away. Call your local emergency services (911 in the [place] Do not drive yourself to the hospital. Summary After the procedure, it is normal to have bruising and tenderness at the puncture site in your groin, neck, or forearm. Check your puncture site every day for signs of infection. Get help right away if your puncture site is bleeding and the bleeding does not stop after applying firm pressure to the area. This is a medical emergency.  This information is not intended to replace advice given to you by your health care provider. Make sure you discuss any questions you have with your health care provider.     "

## 2024-07-04 NOTE — H&P (Signed)
 "  Date: 07/04/24 ID:  Travis Hendricks, DOB July 11, 1940, MRN [id number] PCP: Vernadine Charlie ORN, MD  [facility] Providers Cardiologist:  None Electrophysiologist:  Donnice DELENA Primus, MD    History of Present Illness Travis Hendricks is a 84 y.o. male with paroxysmal AF/AFL, HTN and HLD who presents for LAAO evaluation.   History of Present Illness He has a history of atrial fibrillation and atrial arrhythmias, with a loop monitor previously placed for monitoring. An initial episode of atrial fibrillation occurred in early 2025, associated with a feeling of passing out. A subsequent episode almost a year ago led to the resumption of blood thinners. Since Nov 15, 2023, no atrial fibrillation episodes have been recorded according to the loop monitor.   His blood pressure has been fluctuating, with morning readings as high as 160/109 mmHg and afternoon readings around 130/70 mmHg. He attributes some of the increase to dietary salt intake, specifically mentioning pretzels with extra salt, which he has since stopped consuming. No recent changes in medication have been made.   No history of stroke, diabetes, heart attacks, or heart failure. His resting heart rate is typically in the low 40s, and he has experienced it dropping to 36-38 bpm.   ROS: none    Studies Reviewed   ECG review 09/13/23: SB 55, PR 196, QRS 130, QT/c 438/419 12/18/22: AF/RVR 115, QRS 134, QT/c 372/515 12/18/22: AF/RVR 126, QRS 136, QT/c 342/496   TTE Result date: 01/04/23  1. Left ventricular ejection fraction, by estimation, is 55%. The left  ventricle has normal function. The left ventricle has no regional wall  motion abnormalities. Left ventricular diastolic parameters are consistent  with Grade I diastolic dysfunction  (impaired relaxation). The average left ventricular global longitudinal  strain is -17.7 %. The global longitudinal strain is normal.   2. Right ventricular systolic function is mildly  reduced. The right  ventricular size is mildly enlarged.   3. Left atrial size was moderately dilated.   4. Right atrial size was moderately dilated.   5. The mitral valve is normal in structure. Mild mitral valve  regurgitation. No evidence of mitral stenosis.   6. The aortic valve is tricuspid. There is mild calcification of the  aortic valve. There is mild thickening of the aortic valve. Aortic valve  regurgitation is not visualized. Aortic valve sclerosis is present, with  no evidence of aortic valve stenosis.   7. Aortic dilatation noted. There is mild dilatation of the ascending  aorta, measuring 38 mm.   8. The inferior vena cava is normal in size with greater than 50%  respiratory variability, suggesting right atrial pressure of 3 mmHg.    Risk Assessment/Calculations   CHA2DS2-VASc Score = 3  This indicates a 3.2% annual risk of stroke. The patient's score is based upon: CHF History: 0 HTN History: 1 Diabetes History: 0 Stroke History: 0 Vascular Disease History: 0 Age Score: 2 Gender Score: 0   Physical Exam VS:  BP (!) 143/75   Pulse 60   Ht 5' 8 (1.727 m)   Wt 164 lb (74.4 kg)   SpO2 95%   BMI 24.94 kg/m          Wt Readings from Last 3 Encounters:  05/02/24 164 lb (74.4 kg)  02/14/24 165 lb (74.8 kg)  09/13/23 165 lb 12.8 oz (75.2 kg)    GEN: Well nourished, well developed in no acute distress CARDIAC: RRR, no murmurs, rubs, gallops RESPIRATORY:  Clear to auscultation  without rales, wheezing or rhonchi  EXTREMITIES:  No edema; No deformity    ASSESSMENT AND PLAN Travis Hendricks is a 84 y.o. male with paroxysmal AF/AFL, HTN and HLD who presents for LAAO evaluation.     Paroxysmal AF NYHA class I  I reviewed [name] AF history and he has not had any episodes in the past year.  He had AF/RVR in 11/2022 without any symptomatic recurrences.  He had a strong preference to come off of OAC and he had no symptomatic recurrences so his apixaban  was  discontinued and MDT ILR was implanted for ongoing arrhythmia monitoring.  He unfortunately had AF recurrence on ILR and OAC was resumed.  I reviewed his ILR monitor back to March with no arrhythmia recurrence.  We discussed different options for management of both his AF and associated stroke risk.  He currently is CHA2DS2-VASc of 3 with a 3.2% annual risk of stroke.  He would like to discontinue OAC.  Review of his ECGs all consistent with AF with RVR.  We consider 3 options for management.  The first option would be stopping anticoagulation and waiting until we see any arrhythmia recurrence.  Benefit of this is to avoid procedures but the downside is that he could have an ischemic event as his first presentation of recurrent AF although this is not high likelihood.  The second option would be considering ablation and monitoring for any arrhythmia recurrence afterwards.  If no arrhythmia recurrence within a year then could consider coming off OAC again.  The third option would be proceeding with Watchman implant with the plan for discontinuation of OAC after 6 months and continuing aspirin  thereafter.  He has had no serious bleeding events on apixaban  up to this point.  His main concern is the associated stroke risk and without any significant burden in the past 6 months he is not interested in ablation which is reasonable.  He does not want the possible risk of stroke even with a low burden of AF and not having in the past 6 months.  We reviewed the risk, benefits and alternatives of left atrial appendage occlusion with Watchman including but not limited to vascular access injury, tenderness from insertion site, damage to the heart wall requiring drain and/or median sternotomy, dangerous arrhythmias, cardiac perforation or death.  He and his wife understand these risk and he is wanting to proceed.  They had vacation in late December and would like to plan for January.   I have seen Travis Hendricks in the office  today who is being considered for a Watchman left atrial appendage closure device. I believe they will benefit from this procedure given their history of atrial fibrillation, CHA2DS2-VASc score of 3 and unadjusted ischemic stroke rate of 3.2% per year. Unfortunately, the patient is not felt to be a long term anticoagulation candidate secondary to patient preference. The patient's chart has been reviewed and I feel that they would be a candidate for short term oral anticoagulation after Watchman implant.    It is my belief that after undergoing a LAA closure procedure, DERVIN VORE will not need long term anticoagulation which eliminates anticoagulation side effects and major bleeding risk.    Procedural risks for the Watchman implant have been reviewed with the patient including a 0.5% risk of stroke, <1% risk of perforation and <1% risk of device embolization. Other risks include bleeding, vascular damage, tamponade, worsening renal function, and death. The patient understands these risk and wishes to  proceed.     The published clinical data on the safety and effectiveness of WATCHMAN include but are not limited to the following: - Holmes DR, Jess BEARD, Sick P et al. for the PROTECT AF Investigators. Percutaneous closure of the left atrial appendage versus warfarin therapy for prevention of stroke in patients with atrial fibrillation: a randomised non-inferiority trial. Lancet 2009; 374: 534-42. GLENWOOD Jess BEARD, Doshi SK, Jonita VEAR Satchel D et al. on behalf of the PROTECT AF Investigators. Percutaneous Left Atrial Appendage Closure for Stroke Prophylaxis in Patients With Atrial Fibrillation 2.3-Year Follow-up of the PROTECT AF (Watchman Left Atrial Appendage System for Embolic Protection in Patients With Atrial Fibrillation) Trial. Circulation 2013; 127:720-729. - Alli O, Doshi S,  Kar S, Reddy VY, Sievert H et al. Quality of Life Assessment in the Randomized PROTECT AF (Percutaneous Closure of the Left  Atrial Appendage Versus Warfarin Therapy for Prevention of Stroke in Patients With Atrial Fibrillation) Trial of Patients at Risk for Stroke With Nonvalvular Atrial Fibrillation. J Am Coll Cardiol 2013; 61:1790-8. GLENWOOD Satchel DR, Archer RAMAN, Price M, Whisenant B, Sievert H, Doshi S, Huber K, Reddy V. Prospective randomized evaluation of the Watchman left atrial appendage Device in patients with atrial fibrillation versus long-term warfarin therapy; the PREVAIL trial. Journal of the [employer] of Cardiology, Vol. 4, No. 1, 2014, 1-11. - Kar S, Doshi SK, Sadhu A, Horton R, Osorio J et al. Primary outcome evaluation of a next-generation left atrial appendage closure device: results from the PINNACLE FLX trial. Circulation 2021;143(18)1754-1762.    After today's visit with the patient which was dedicated solely for shared decision making visit regarding LAA closure device, the patient decided to proceed with the LAA appendage closure procedure. Prior to the procedure, I would like to obtain a gated CT scan of the chest with contrast timed for PV/LA visualization.    HAS-BLED score: 2 Hypertension Yes  Abnormal renal and liver function (Dialysis, transplant, Cr >2.26 mg/dL /Cirrhosis or Bilirubin >2x Normal or AST/ALT/AP >3x Normal) No  Stroke No  Bleeding No  Labile INR (Unstable/high INR) No  Elderly (>65) Yes  Drugs or alcohol  (>= 8 drinks/week, anti-plt or NSAID) No    CHA2DS2-VASc Score = 3  The patient's score is based upon: CHF History: 0 HTN History: 1 Diabetes History: 0 Stroke History: 0 Vascular Disease History: 0 Age Score: 2 Gender Score: 0   NYHA class I   Procedure details:  Procedure date: 06/28/23 Imaging: TEE OAC: Continue Eliquis  5 mg bid, hold 06/28/23 AM dose Post procedure: continue Eliquis  x 6 months then transition to monotx ASA 81 mg daily   "

## 2024-07-04 NOTE — Anesthesia Procedure Notes (Signed)
 Procedure Name: Intubation Date/Time: 07/04/2024 7:39 AM  Performed by: Delores Duwaine SAUNDERS, CRNAPre-anesthesia Checklist: Patient identified, Emergency Drugs available, Suction available and Patient being monitored Patient Re-evaluated:Patient Re-evaluated prior to induction Oxygen Delivery Method: Circle System Utilized Preoxygenation: Pre-oxygenation with 100% oxygen Induction Type: IV induction Ventilation: Mask ventilation without difficulty Laryngoscope Size: Mac and 4 Grade View: Grade I Tube type: Oral Number of attempts: 1 Airway Equipment and Method: Stylet and Oral airway Placement Confirmation: ETT inserted through vocal cords under direct vision, positive ETCO2 and breath sounds checked- equal and bilateral Secured at: 22 cm Tube secured with: Tape Dental Injury: Teeth and Oropharynx as per pre-operative assessment

## 2024-07-05 ENCOUNTER — Telehealth: Payer: Self-pay

## 2024-07-05 DIAGNOSIS — I480 Paroxysmal atrial fibrillation: Secondary | ICD-10-CM

## 2024-07-05 DIAGNOSIS — Z95818 Presence of other cardiac implants and grafts: Secondary | ICD-10-CM

## 2024-07-05 MED FILL — Fentanyl Citrate Preservative Free (PF) Inj 100 MCG/2ML: INTRAMUSCULAR | Qty: 2 | Status: AC

## 2024-07-05 NOTE — Telephone Encounter (Signed)
" °  [facility]   Watchman Team  Contacted the patient regarding discharge from [facility] on 07/04/2024.  The patient understands to follow up with Charlies Arthur PA-C on 08/19/2024.  The patient understands discharge instructions? Yes Patient will finish out current Eliquis  script. He will then start 81mg  aspirin  and 75mg  plavix  daily per Dr. Almetta.   The patient understands medications and regimen? Yes   The patient reports groin sites are healing well with no pain, swelling or drainage.   Scheduled post procedure imaging on 09/02/2024.  Reiterated to patient to avoid dental visits prior to their upcoming follow-up appointment. Instructed them to call after that visit if they have a dental visit so antibiotics may be called in. Reiterated they will need dental SBE through 6 months post-procedure.  The patient understands to call with any questions or concerns prior to scheduled visit.   The patient was grateful for call and agreed with plan.   "

## 2024-07-20 ENCOUNTER — Ambulatory Visit

## 2024-07-23 ENCOUNTER — Ambulatory Visit

## 2024-07-23 LAB — CUP PACEART REMOTE DEVICE CHECK
Date Time Interrogation Session: 20260202233018
Implantable Pulse Generator Implant Date: 20240925

## 2024-08-19 ENCOUNTER — Ambulatory Visit: Admitting: Physician Assistant

## 2024-08-23 ENCOUNTER — Ambulatory Visit

## 2024-09-02 ENCOUNTER — Other Ambulatory Visit (HOSPITAL_COMMUNITY)

## 2024-09-23 ENCOUNTER — Ambulatory Visit

## 2024-10-03 ENCOUNTER — Ambulatory Visit (HOSPITAL_COMMUNITY): Admitting: Internal Medicine

## 2024-10-24 ENCOUNTER — Ambulatory Visit

## 2024-11-24 ENCOUNTER — Ambulatory Visit

## 2024-12-25 ENCOUNTER — Ambulatory Visit

## 2025-01-25 ENCOUNTER — Ambulatory Visit

## 2025-02-25 ENCOUNTER — Ambulatory Visit

## 2025-03-28 ENCOUNTER — Ambulatory Visit

## 2025-04-28 ENCOUNTER — Ambulatory Visit

## 2025-05-29 ENCOUNTER — Ambulatory Visit

## 2025-06-29 ENCOUNTER — Ambulatory Visit
